# Patient Record
Sex: Male | Born: 1955 | Race: Black or African American | Hispanic: No | Smoking: Current every day smoker
Health system: Southern US, Community
[De-identification: ages and names within clinical notes are randomized; demographics above are authoritative.]

## PROBLEM LIST (undated history)

## (undated) DIAGNOSIS — I10 Essential (primary) hypertension: Secondary | ICD-10-CM

## (undated) DIAGNOSIS — I639 Cerebral infarction, unspecified: Secondary | ICD-10-CM

## (undated) HISTORY — DX: Essential (primary) hypertension: I10

---

## 2011-03-29 ENCOUNTER — Emergency Department (HOSPITAL_COMMUNITY): Payer: Self-pay

## 2011-03-29 ENCOUNTER — Inpatient Hospital Stay (HOSPITAL_COMMUNITY)
Admission: EM | Admit: 2011-03-29 | Discharge: 2011-04-02 | DRG: 064 | Disposition: A | Discharge status: Rehabilitation Facility | Payer: Self-pay | Attending: Internal Medicine | Admitting: Internal Medicine

## 2011-03-29 DIAGNOSIS — Z7982 Long term (current) use of aspirin: Secondary | ICD-10-CM

## 2011-03-29 DIAGNOSIS — E876 Hypokalemia: Secondary | ICD-10-CM | POA: Diagnosis present

## 2011-03-29 DIAGNOSIS — R279 Unspecified lack of coordination: Secondary | ICD-10-CM | POA: Diagnosis present

## 2011-03-29 DIAGNOSIS — R269 Unspecified abnormalities of gait and mobility: Secondary | ICD-10-CM

## 2011-03-29 DIAGNOSIS — I6340 Cerebral infarction due to embolism of unspecified cerebral artery: Principal | ICD-10-CM | POA: Diagnosis present

## 2011-03-29 DIAGNOSIS — G9340 Encephalopathy, unspecified: Secondary | ICD-10-CM | POA: Diagnosis present

## 2011-03-29 DIAGNOSIS — Z560 Unemployment, unspecified: Secondary | ICD-10-CM

## 2011-03-29 DIAGNOSIS — E119 Type 2 diabetes mellitus without complications: Secondary | ICD-10-CM | POA: Diagnosis present

## 2011-03-29 DIAGNOSIS — F17200 Nicotine dependence, unspecified, uncomplicated: Secondary | ICD-10-CM | POA: Diagnosis present

## 2011-03-29 DIAGNOSIS — F10939 Alcohol use, unspecified with withdrawal, unspecified: Secondary | ICD-10-CM | POA: Diagnosis present

## 2011-03-29 DIAGNOSIS — F1010 Alcohol abuse, uncomplicated: Secondary | ICD-10-CM | POA: Diagnosis present

## 2011-03-29 DIAGNOSIS — I10 Essential (primary) hypertension: Secondary | ICD-10-CM | POA: Diagnosis present

## 2011-03-29 DIAGNOSIS — F10239 Alcohol dependence with withdrawal, unspecified: Secondary | ICD-10-CM | POA: Diagnosis present

## 2011-03-29 DIAGNOSIS — F29 Unspecified psychosis not due to a substance or known physiological condition: Secondary | ICD-10-CM

## 2011-03-29 DIAGNOSIS — H538 Other visual disturbances: Secondary | ICD-10-CM | POA: Diagnosis present

## 2011-03-29 LAB — COMPREHENSIVE METABOLIC PANEL
ALT: 34 U/L (ref 0–53)
CO2: 28 mEq/L (ref 19–32)
Calcium: 11.1 mg/dL — ABNORMAL HIGH (ref 8.4–10.5)
Creatinine, Ser: 1.03 mg/dL (ref 0.50–1.35)
GFR calc Af Amer: >90 mL/min (ref >90)
GFR calc non Af Amer: 79 mL/min — ABNORMAL LOW (ref >90)
Glucose, Bld: 105 mg/dL — ABNORMAL HIGH (ref 70–99)

## 2011-03-29 LAB — DIFFERENTIAL
Lymphs Abs: 1.1 10*3/uL (ref 0.7–4.0)
Monocytes Relative: 9 % (ref 3–12)
Neutro Abs: 9.5 10*3/uL — ABNORMAL HIGH (ref 1.7–7.7)
Neutrophils Relative %: 82 % — ABNORMAL HIGH (ref 43–77)

## 2011-03-29 LAB — AMMONIA: Ammonia: 23 umol/L (ref 11–60)

## 2011-03-29 LAB — URINALYSIS, ROUTINE W REFLEX MICROSCOPIC
Glucose, UA: 100 mg/dL — AB
Ketones, ur: 15 mg/dL — AB
Leukocytes, UA: NEGATIVE
Protein, ur: NEGATIVE mg/dL

## 2011-03-29 LAB — CBC
Hemoglobin: 17.4 g/dL — ABNORMAL HIGH (ref 13.0–17.0)
MCH: 30.7 pg (ref 26.0–34.0)
MCV: 82.4 fL (ref 78.0–100.0)
RBC: 5.67 MIL/uL (ref 4.22–5.81)

## 2011-03-30 ENCOUNTER — Inpatient Hospital Stay (HOSPITAL_COMMUNITY): Payer: Self-pay

## 2011-03-30 ENCOUNTER — Encounter: Payer: Self-pay | Admitting: Internal Medicine

## 2011-03-30 LAB — BASIC METABOLIC PANEL
BUN: 27 mg/dL — ABNORMAL HIGH (ref 6–23)
BUN: 21 mg/dL (ref 6–23)
CO2: 29 mEq/L (ref 19–32)
CO2: 28 mEq/L (ref 19–32)
Calcium: 9.5 mg/dL (ref 8.4–10.5)
Chloride: 90 mEq/L — ABNORMAL LOW (ref 96–112)
Creatinine, Ser: 0.8 mg/dL (ref 0.50–1.35)
Creatinine, Ser: 0.7 mg/dL (ref 0.50–1.35)
GFR calc Af Amer: >90 mL/min (ref >90)
GFR calc non Af Amer: >90 mL/min (ref >90)
Glucose, Bld: 131 mg/dL — ABNORMAL HIGH (ref 70–99)
Glucose, Bld: 85 mg/dL (ref 70–99)

## 2011-03-30 LAB — LIPID PANEL
Cholesterol: 109 mg/dL (ref 0–200)
HDL: 27 mg/dL — ABNORMAL LOW (ref >39)
Total CHOL/HDL Ratio: 4 RATIO
Triglycerides: 61 mg/dL (ref <150)

## 2011-03-30 LAB — HIV ANTIBODY (ROUTINE TESTING W REFLEX): HIV: NONREACTIVE

## 2011-03-30 LAB — HEMOGLOBIN A1C: Mean Plasma Glucose: 128 mg/dL — ABNORMAL HIGH (ref <117)

## 2011-03-30 LAB — GLUCOSE, CAPILLARY
Glucose-Capillary: 100 mg/dL — ABNORMAL HIGH (ref 70–99)
Glucose-Capillary: 90 mg/dL (ref 70–99)

## 2011-03-30 LAB — RAPID URINE DRUG SCREEN, HOSP PERFORMED
Amphetamines: NOT DETECTED
Barbiturates: NOT DETECTED

## 2011-03-30 LAB — CK TOTAL AND CKMB (NOT AT ARMC)
CK, MB: 2 ng/mL (ref 0.3–4.0)
Total CK: 101 U/L (ref 7–232)

## 2011-03-30 LAB — ETHANOL: Alcohol, Ethyl (B): <11 mg/dL (ref 0–11)

## 2011-03-30 NOTE — H&P (Signed)
Hospital Admission Note Date: 03/30/2011  Patient name: Matthew Abbott Medical record number: [id number] Date of birth: 1956-03-20 Age: 55 y.o. Gender: male PCP: Dr. Arvil Persons ([facility]) but hasn't gone in 5-6 years  Medical Service: Internal Medicine Teaching Service  Attending physician: Dr. Cliffton Asters    1st Contact: Dr. Margorie John  Pager: [phone] 2nd Contact: Dr. Johnette Abraham Pager: [phone] After 5 pm or weekends: 1st Contact:      Pager: [phone] 2nd Contact:      Pager: [phone]  Chief Complaint: Abnormal Gait, Confusion History of Present Illness: Patient is a 55 yo man with PMH significant for HTN who presents with 1-2 days of abnormal gait & confusion that has since been improving.  He reports that 1 day PTA, he was sitting on a porch and all of a sudden felt something funny in his legs.  He needed assistance to stand up, and it is unclear if he sought medical attention from a nurse at that time.  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  His story is difficult to piece together, but his daughter was able to help with details, as she was called on the day of admission since her father had increased confusion, difficulty with gait & elevated blood pressures.  Patient reports that on the day of admission, BP was found to be 171/102 and then 168/103.  He was given 2 pills (daugther reports HCTZ, Lisinopril & Phenergan) and blood pressure decreased to 152/91.  He notes associated vomiting x 3 after taking medications, and reports non-bloody nor black vomitus to be yellow, and only 1 nonbloody, loose BM on the day prior to arrival.  He denies abdominal pain. He notes tolerating soup given in the ED but prior to that, he had no food since having a doughnut since 2 days PTA & he has not had an appetite.  Daughter describes confusion as calling her by the wrong name or knowing where his belongings are (ie-asking  for his cell phone, but it was left at home purposely).  Daughter also reports slurred speech on the day of arrival that has since resolved.  Finally patient notes increased blurry vision in left eye, but this has been ongoing for some months.  He reports that a branch hit his eye earlier in the summer and since he has noticed discharge & crusting each morning, which he has tried to treat with warm compresses upon suggestion by his daughter.  He denies acute vision changes over the last few days.    Meds: None  Allergies: NKDA  Past Medical History: HTN (hasn't been to MD in 5-6 years, reports Doctor took him off of antihypertensives)  Past Surgical History: none  Family History:  Mom: DM, HTN, ESRD, died @ 11 of heart failure Dad: ESRD, heart failure, Prostate Ca, died @ 57 yo  Social History  . Marital Status: Married    Spouse Name: N/A    Number of Children: Daughter, Lequita Halt (graduated from [employer] school recently)   Occupational History  . Currently unemployed   Social History Main Topics  . Smoking status: Current, 0.5 PPD x 42 years  . Alcohol Use: 24 oz, 1-2 cans beer/day, no history of DTs/withdrawal, but has never tried to quit before  . Drug Use: Denies illicit drug use   Social History Narrative  . 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  He lives alone in [place], but has a daughter and niece as close family support   Review of Systems: General: no fevers, chills, changes in weight, decrease in appetite Skin: no rash, dry skin HEENT: left eye blurry vision (chronic), no hearing changes, no sore throat Pulm: no dyspnea, coughing, wheezing CV: no chest pain, palpitations, shortness of breath Abd: as per HPI GU: no dysuria, hematuria, polyuria Ext: no arthralgias, myalgias Neuro: no weakness, numbness, or tingling  Physical Exam: Vitals: T:98.4     HR:97     BP: 135/81    RR:20     O2  saturation: 97% RA General: resting in bed, no acute distress HEENT: PERRL, EOMI, no scleral icterus, bilateral conjunctival injection, no conjunctival pallor Cardiac: RRR, no rubs, murmurs or gallops, normal S1, S2 Pulm: clear to auscultation bilaterally, moving normal volumes of air, no wheezing/rales/rhonchi Abd: soft, mildly tender to epigastric region, nondistended, BS normoactive Ext: warm and well perfused, no pedal edema Neuro: alert and oriented X3 (& to president of [place]), cranial nerves II-XII grossly intact, strength and sensation to light touch equal in bilateral upper and lower extremities, cerebellar function intact by finger-to-nose, heel-to-shin, & rapid alternating hand movments, unsteady, shuffling gait by both legs, DTRs grossly intact  Lab results: Basic Metabolic Panel:  [facility] 03/29/11 2003  NA 130*  K 4.5  CL 89*  CO2 28  GLUCOSE 105*  BUN 29*  CREATININE 1.03  CALCIUM 11.1*  MG --  PHOS --   Liver Function Tests:  Basename 03/29/11 2003  AST 33  ALT 34  ALKPHOS 118*  BILITOT 1.2  PROT 8.1  ALBUMIN 4.3    Basename 03/29/11 2003  AMMONIA 23   CBC:  Basename 03/29/11 2003  WBC 11.6*  NEUTROABS 9.5*  HGB 17.4*  HCT 46.7  MCV 82.4  PLT 157   Acetone, Blood                           NEGATIVE          NEG  Ammonia                                  23                11-60            umol/L   Color, Urine                             AMBER      a      YELLOW    BIOCHEMICALS MAY BE AFFECTED BY COLOR  Appearance                               CLOUDY     a      CLEAR  Specific Gravity                         1.022             1.005-1.030  pH  5.0               5.0-8.0  Urine Glucose                            100        a      NEG              mg/dL  Bilirubin                                NEGATIVE          NEG  Ketones                                  15         a      NEG              mg/dL  Blood                                     NEGATIVE          NEG  Protein                                  NEGATIVE          NEG              mg/dL  Urobilinogen                             0.2               0.0-1.0          mg/dL  Nitrite                                  NEGATIVE          NEG  Leukocytes                               NEGATIVE          NEG    MICROSCOPIC NOT DONE ON URINES WITH NEGATIVE PROTEIN, BLOOD, LEUKOCYTES,    NITRITE, OR GLUCOSE <1000 mg/dL.   Imaging results:  03/29/2011 CT HEAD WITHOUT CONTRAST  Comparison: None.   Findings: Hypodensity right superior cerebellum, most likely a subacute infarct.  Mass lesion not completely excluded.  Ventricle size is normal.  Negative for hemorrhage.  Calvarium intact.   IMPRESSION: Hypodensity right superior cerebellum, most likely an acute infarct without hemorrhage.  Follow-up MRI is suggested.    Assessment & Plan by Problem: 55 yo man with history of alcohol abuse p/w  #Ataxia/Abnormal Gait: Patient's symptoms suggest stroke, which is supported by CT scan (non hemorrhagic right cerebellar stroke).  Risk factors include HTN (per history) & smoking.  Other unlikely causes of abnormal gait include insult to sensory system by B12 deficiency, syphilis, or hypothyroidism.   Plan: follow up with neuro recommendations, admit to tele -MRI brain, MRA head without contrast,  TSH, RPR, B12, EKG -swallow eval -FLP and HbA1c with AML for risk stratification, monitor CBGs, UDS, CE -PT/OT  #Confusion: Acute onset confusion coincides with gait abnormalities and seems to be improving, which suggests that stroke may have been contributing.  Confusion may also be related to electrolyte abnormalities as patient is hyponatremic.  Hyponatremia is likely secondary to decreased PO intake & vomiting.  Infectious etiology may also cause confusion, though patient does not experience any symptoms of such.  Finally, if patient is experiencing delayed onset withdrawal or had  more recent EtOH use, he may be experiencing confusion 2/2 withdrawal.   Plan: Continue to monitor, hydrate with NS @ 125cc/h, check orthostatic vital signs, AML: BMET, UA & culture, CXR  #Nausea/Vomiting: Patient reports one day of nausea with 3 episodes of vomiting, which may be stroke related.  Patient could also be experiencing delayed EtOH withdrawal.  Infectious etiology unlikely as patient denies fever, has not had any food that might suggest infection, & has had limited episodes of vomiting without diarrhea that have been isolated with other symptoms.   Plan: Zofran 4mg  IV q6h prn N/V, once swallow screen passed, clear liquids and diet advanced as tolerated, if symptoms worsen, consider Lipase/Amylase  #EtOH Abuse: Though patient is likely out of the withdrawal window, and denies current or past symptoms of DTs, patient will be monitored with CIWA observation. Plan: start thiamine & folic acid supplementation, EtOH level    #Tobacco Abuse: Patient seems agreeable to discussing smoking cessation. Plan: smoking cessation consult, Nicotine Patch 14mg  daily  #HTN: Patient's blood pressure at goal at admission, though patient reports history of HTN.  Plan: Continue to monitor BP during hospitalization to evaluate if an anti-hypertensive regimen is needed.  #VTE Prophylaxis: Lovenox 40mg  [place] daily  #left eye blurry vision: patient may have corneal abrasion and may benefit from outpatient opthalmology appointment.   R3______________________________ (Dr. Nelda Bucks, [phone])   R1________________________________ (Dr. Vernice Jefferson, [phone])  ATTENDING: I performed and/or observed a history and physical examination of the patient.  I discussed the case with the residents as noted and reviewed the residents' notes.  I agree with the findings and plan--please refer to the attending physician note for more details.  Signature________________________________  Printed  Name_____________________________

## 2011-03-31 ENCOUNTER — Inpatient Hospital Stay (HOSPITAL_COMMUNITY): Payer: Self-pay

## 2011-03-31 DIAGNOSIS — F29 Unspecified psychosis not due to a substance or known physiological condition: Secondary | ICD-10-CM

## 2011-03-31 DIAGNOSIS — R269 Unspecified abnormalities of gait and mobility: Secondary | ICD-10-CM

## 2011-03-31 DIAGNOSIS — I6330 Cerebral infarction due to thrombosis of unspecified cerebral artery: Secondary | ICD-10-CM

## 2011-03-31 LAB — BASIC METABOLIC PANEL
BUN: 12 mg/dL (ref 6–23)
Calcium: 8.6 mg/dL (ref 8.4–10.5)
Creatinine, Ser: 0.57 mg/dL (ref 0.50–1.35)
GFR calc Af Amer: >90 mL/min (ref >90)
GFR calc non Af Amer: >90 mL/min (ref >90)

## 2011-03-31 MED ORDER — GADOBENATE DIMEGLUMINE 529 MG/ML IV SOLN
15.0000 mL | Freq: Once | INTRAVENOUS | Status: AC
  Administered 2011-03-31: 15 mL via INTRAVENOUS

## 2011-03-31 NOTE — Consult Note (Signed)
NAMEARLANDO, [name]:  [id number]  MEDICAL RECORD NO.:  [id number]  LOCATION:  [facility]                         FACILITY:  [facility]  PHYSICIAN:  Lajuana Carry, MD       DATE OF BIRTH:  10/12/1955  DATE OF CONSULTATION:  03/29/2011 DATE OF DISCHARGE:                                CONSULTATION   CHIEF COMPLAINT:  Confusion, possible stroke.  HISTORY:  [name] is a 55 year old African American male who presents for evaluation of new onset of confusion and difficulty with walking. The patient attempted to relate some of the history, but due to his confusion, his daughter relates most of it.  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  On Friday, when the daughter dropped him off, he was in his normal state of health, acting appropriately.  Over the course of the weekend, the patient developed severe stomachache, nausea, and abdominal pain.  He vomited several times.  He did see the nurse that was on this worksite, who noted that he had high blood pressure particularly today.  The patient was given several medications to lower that blood pressure and it did not come down significantly.  His blood pressure was noted to be 171/102 and his blood sugar was 232 at that time.  At this point, he was having some difficulty walking and they felt that he was weak allover.  He was confused and saying very strange things to the employees there.  They called his daughter to pick him up and he called his daughter by several long names and was also saying some strange things.  The patient did not have any hallucinations and no activity concerning for seizures.  He states that his last drink was on Wednesday, which is 4 days ago.  MEDICATIONS:  The patient takes home medications.  ALLERGIES:  He has no known drug allergies.  PAST MEDICAL HISTORY: 1. Hypertension. 2. Alcohol abuse.  FAMILY HISTORY:  Significant for hypertension and  alcohol abuse.  SOCIAL HISTORY:  The patient smokes approximately a third of pack cigarettes daily and drinks 1-2, 24-ounce beers a day.  REVIEW OF SYSTEMS:  A complete review of systems was performed and was negative.  PHYSICAL EXAMINATION:  VITAL SIGNS:  Temperature of 98.4, pulse of 97, blood pressure 135/81, respirations of 20, and oxygen saturation 97%. GENERAL:  He is awake and in no apparent distress. HEENT:  The patient has poor dentition.  His oropharynx is clear.  There is no cervical lymphadenopathy. CARDIOVASCULAR:  He had a regular rate and rhythm with no murmurs and no carotid bruits were noted. RESPIRATIONS:  His lungs were clear to auscultation bilaterally. ABDOMEN:  Soft, mildly tender throughout, nondistended and bowel sounds were present. EXTREMITIES:  There was no lower extremity peripheral edema. MENTAL STATUS:  He was alert and oriented x2.  He could follow simple and complex commands well.  His attention and concentration were intact, but his memory was impaired.  Speech was normal and fluent. CRANIAL NERVES:  Cranial nerve II; his visual fields were intact to confrontation bilaterally.  Cranial nerves III, IV, and VI; extraocular movements  were intact and his pupils were equal, round, reactive to light and accommodation.  Cranial nerve V; facial sensation was intact. Cranial nerve VII; facial movements were symmetric.  Cranial nerve VIII, auditory acuity was normal.  Cranial nerves IX and X; his palate and uvula were midline and elevated symmetrically in the midline.  Cranial nerve XI; he had 5/5 trapezius strength.  Cranial nerve XII, his tongue protruded in the midline. MOTOR:  Demonstrate a 5/5 strength in his bilateral upper and lower extremities and muscle groups proximally and distally.  The patient did have outward drift of his right hand when looking for pronator drift. There was no increased tone. SENSATION:  Sensation was intact to light touch and  pinprick throughout. REFLEXES:  1+ and symmetric in the bilateral upper and lower extremities.  There was no Babinski. COORDINATION:  He had intact finger-to-nose and heel-to-shin on the left.  He had dysmetria on the right arm and leg. GAIT:  Deferred due to his ataxia.  CT scan of the head was reviewed which demonstrated hyperdensity in the right superior cerebellum, which is most likely a stroke, although a mass lesion is not completely excluded.  Lab work was reviewed and revealed a white count of 12.6, hemoglobin of 17.4, platelets of 157.  Sodium of 130, potassium 4.5, chloride 89, CO2 of 28, glucose of 105, BUN of 29, creatinine of 1, bilirubin of 1.2, and normal AST and ALT.  ASSESSMENT:  [name] is a 55 year old male who presents with new- onset ataxia and confusion in the setting of hypertension, a few metabolic abnormalities, and alcoholism with his last drink 4 days ago. His exam demonstrates right-sided ataxia, which is consistent with his imaging showing a right superior cerebellar lesion.  Imaging could be consistent with stroke, although malignancy cannot be completely excluded.  A superior cerebellar lesion would not necessarily explain the patient's confusion.  Confusion could be due to many factors including his elevated blood pressure, potential for alcohol withdrawal and metabolic abnormalities mentioned with possible infectious etiologies if he does have leukocytosis.  PLAN: 1. We recommend evaluation and admission by the internal medicine     team. 2. We recommend obtaining an MRI of the brain and MRA of the head     without contrast to better characterize the lesion seen on the CT     scan.  We would hold on any other stroke workup or treatment until     the MRI has been obtained. 3. We would recommend checking a TSH, RPR, B12, and supplement the     patient with thiamine while placing him on the alcohol withdrawal     protocol. 4. Please check a UA  and culture, chest x-ray, and consider amylase     and lipase.          ______________________________ Lajuana Carry, MD     DM/MEDQ  D:  03/29/2011  T:  03/30/2011  Job:  [id number]  Electronically Signed by Lajuana Carry MD on 03/31/2011 10:45:34 AM

## 2011-04-01 DIAGNOSIS — R269 Unspecified abnormalities of gait and mobility: Secondary | ICD-10-CM

## 2011-04-01 DIAGNOSIS — F29 Unspecified psychosis not due to a substance or known physiological condition: Secondary | ICD-10-CM

## 2011-04-02 ENCOUNTER — Inpatient Hospital Stay (HOSPITAL_COMMUNITY)
Admission: RE | Admit: 2011-04-02 | Discharge: 2011-04-08 | DRG: 945 | Disposition: A | Discharge status: Home Health | Payer: Self-pay | Source: Ambulatory Visit | Attending: Physical Medicine & Rehabilitation | Admitting: Physical Medicine & Rehabilitation

## 2011-04-02 DIAGNOSIS — I6350 Cerebral infarction due to unspecified occlusion or stenosis of unspecified cerebral artery: Secondary | ICD-10-CM

## 2011-04-02 DIAGNOSIS — F1010 Alcohol abuse, uncomplicated: Secondary | ICD-10-CM

## 2011-04-02 DIAGNOSIS — N401 Enlarged prostate with lower urinary tract symptoms: Secondary | ICD-10-CM

## 2011-04-02 DIAGNOSIS — I6330 Cerebral infarction due to thrombosis of unspecified cerebral artery: Secondary | ICD-10-CM

## 2011-04-02 DIAGNOSIS — F17200 Nicotine dependence, unspecified, uncomplicated: Secondary | ICD-10-CM

## 2011-04-02 DIAGNOSIS — R269 Unspecified abnormalities of gait and mobility: Secondary | ICD-10-CM

## 2011-04-02 DIAGNOSIS — Z7982 Long term (current) use of aspirin: Secondary | ICD-10-CM

## 2011-04-02 DIAGNOSIS — I6789 Other cerebrovascular disease: Secondary | ICD-10-CM

## 2011-04-02 DIAGNOSIS — I10 Essential (primary) hypertension: Secondary | ICD-10-CM

## 2011-04-02 DIAGNOSIS — Z8249 Family history of ischemic heart disease and other diseases of the circulatory system: Secondary | ICD-10-CM

## 2011-04-02 DIAGNOSIS — N138 Other obstructive and reflux uropathy: Secondary | ICD-10-CM

## 2011-04-02 DIAGNOSIS — Z7901 Long term (current) use of anticoagulants: Secondary | ICD-10-CM

## 2011-04-02 DIAGNOSIS — Z720 Tobacco use: Secondary | ICD-10-CM | POA: Diagnosis present

## 2011-04-02 DIAGNOSIS — I639 Cerebral infarction, unspecified: Secondary | ICD-10-CM | POA: Diagnosis present

## 2011-04-02 DIAGNOSIS — R339 Retention of urine, unspecified: Secondary | ICD-10-CM

## 2011-04-02 DIAGNOSIS — I69993 Ataxia following unspecified cerebrovascular disease: Secondary | ICD-10-CM

## 2011-04-02 DIAGNOSIS — Z5189 Encounter for other specified aftercare: Principal | ICD-10-CM

## 2011-04-02 DIAGNOSIS — F29 Unspecified psychosis not due to a substance or known physiological condition: Secondary | ICD-10-CM

## 2011-04-02 DIAGNOSIS — R279 Unspecified lack of coordination: Secondary | ICD-10-CM

## 2011-04-02 LAB — LUPUS ANTICOAGULANT PANEL
DRVVT: 37.2 secs (ref 34.1–42.2)
PTT Lupus Anticoagulant: 40 secs (ref 28.0–43.0)

## 2011-04-02 LAB — PROTEIN S ACTIVITY: Protein S Activity: 116 % (ref 69–129)

## 2011-04-02 LAB — BASIC METABOLIC PANEL
BUN: 8 mg/dL (ref 6–23)
Chloride: 97 mEq/L (ref 96–112)
GFR calc Af Amer: >90 mL/min (ref >90)
Potassium: 3.5 mEq/L (ref 3.5–5.1)

## 2011-04-02 LAB — PROTEIN C ACTIVITY: Protein C Activity: 116 % (ref 75–133)

## 2011-04-02 LAB — BETA-2-GLYCOPROTEIN I ABS, IGG/M/A
Beta-2-Glycoprotein I IgA: 5 A Units (ref <20)
Beta-2-Glycoprotein I IgM: 0 M Units (ref <20)

## 2011-04-02 LAB — ANTITHROMBIN III: AntiThromb III Func: 83 % (ref 76–126)

## 2011-04-02 LAB — ANA: Anti Nuclear Antibody(ANA): NEGATIVE

## 2011-04-02 LAB — CARDIOLIPIN ANTIBODIES, IGG, IGM, IGA: Anticardiolipin IgA: 8 APL U/mL — ABNORMAL LOW (ref <22)

## 2011-04-03 DIAGNOSIS — I6330 Cerebral infarction due to thrombosis of unspecified cerebral artery: Secondary | ICD-10-CM

## 2011-04-03 DIAGNOSIS — I69993 Ataxia following unspecified cerebrovascular disease: Secondary | ICD-10-CM

## 2011-04-03 LAB — URINALYSIS, ROUTINE W REFLEX MICROSCOPIC
Glucose, UA: 100 mg/dL — AB
Hgb urine dipstick: NEGATIVE
Ketones, ur: 15 mg/dL — AB
Protein, ur: NEGATIVE mg/dL

## 2011-04-03 LAB — COMPREHENSIVE METABOLIC PANEL
ALT: 26 U/L (ref 0–53)
AST: 26 U/L (ref 0–37)
Albumin: 3 g/dL — ABNORMAL LOW (ref 3.5–5.2)
Alkaline Phosphatase: 92 U/L (ref 39–117)
CO2: 28 mEq/L (ref 19–32)
Chloride: 99 mEq/L (ref 96–112)
GFR calc non Af Amer: >90 mL/min (ref >90)
Potassium: 3.7 mEq/L (ref 3.5–5.1)
Total Bilirubin: 0.8 mg/dL (ref 0.3–1.2)

## 2011-04-03 LAB — CBC
Platelets: 267 10*3/uL (ref 150–400)
RBC: 4.68 MIL/uL (ref 4.22–5.81)
RDW: 14.4 % (ref 11.5–15.5)
WBC: 7.3 10*3/uL (ref 4.0–10.5)

## 2011-04-03 LAB — DIFFERENTIAL
Basophils Absolute: 0.1 10*3/uL (ref 0.0–0.1)
Lymphs Abs: 1.6 10*3/uL (ref 0.7–4.0)
Monocytes Absolute: 1.2 10*3/uL — ABNORMAL HIGH (ref 0.1–1.0)
Monocytes Relative: 17 % — ABNORMAL HIGH (ref 3–12)
Neutrophils Relative %: 58 % (ref 43–77)

## 2011-04-03 MED ORDER — VITAMIN B-1 100 MG PO TABS
100.0000 mg | ORAL_TABLET | Freq: Every day | ORAL | Status: DC
  Administered 2011-04-05 – 2011-04-08 (×4): 100 mg via ORAL
  Filled 2011-04-03 (×8): qty 1

## 2011-04-03 MED ORDER — TAMSULOSIN HCL 0.4 MG PO CAPS
0.4000 mg | ORAL_CAPSULE | Freq: Every day | ORAL | Status: DC
  Administered 2011-04-05 – 2011-04-07 (×3): 0.4 mg via ORAL
  Filled 2011-04-03 (×7): qty 1

## 2011-04-03 MED ORDER — HYDROCHLOROTHIAZIDE 12.5 MG PO CAPS
12.5000 mg | ORAL_CAPSULE | Freq: Every day | ORAL | Status: DC
  Administered 2011-04-05 – 2011-04-08 (×4): 12.5 mg via ORAL
  Filled 2011-04-03 (×7): qty 1

## 2011-04-03 MED ORDER — PROMETHAZINE HCL 25 MG/ML IJ SOLN: 12.5000 mg | Freq: Four times a day (QID) | INTRAMUSCULAR | Status: DC | PRN

## 2011-04-03 MED ORDER — PROMETHAZINE HCL 12.5 MG RE SUPP: 12.5000 mg | Freq: Four times a day (QID) | RECTAL | Status: DC | PRN

## 2011-04-03 MED ORDER — BISACODYL 10 MG RE SUPP: 10.0000 mg | Freq: Every day | RECTAL | Status: DC | PRN

## 2011-04-03 MED ORDER — SORBITOL 70 % SOLN: 30.0000 mL | Freq: Two times a day (BID) | Status: DC | PRN

## 2011-04-03 MED ORDER — TRAZODONE HCL 50 MG PO TABS: 25.0000 mg | ORAL_TABLET | Freq: Every evening | ORAL | Status: DC | PRN

## 2011-04-03 MED ORDER — ASPIRIN EC 325 MG PO TBEC
325.0000 mg | DELAYED_RELEASE_TABLET | Freq: Every day | ORAL | Status: DC
  Administered 2011-04-05 – 2011-04-08 (×4): 325 mg via ORAL
  Filled 2011-04-03 (×7): qty 1

## 2011-04-03 MED ORDER — NICOTINE 14 MG/24HR TD PT24
14.0000 mg | MEDICATED_PATCH | Freq: Every day | TRANSDERMAL | Status: DC
  Administered 2011-04-05 – 2011-04-06 (×2): 14 mg via TRANSDERMAL
  Filled 2011-04-03 (×6): qty 1

## 2011-04-03 MED ORDER — ENOXAPARIN SODIUM 40 MG/0.4ML ~~LOC~~ SOLN
40.0000 mg | SUBCUTANEOUS | Status: DC
  Administered 2011-04-05 – 2011-04-07 (×3): 40 mg via SUBCUTANEOUS
  Filled 2011-04-03 (×7): qty 0.4

## 2011-04-03 MED ORDER — PROMETHAZINE HCL 12.5 MG PO TABS: 12.5000 mg | ORAL_TABLET | Freq: Four times a day (QID) | ORAL | Status: DC | PRN

## 2011-04-03 MED ORDER — FOLIC ACID 1 MG PO TABS
1.0000 mg | ORAL_TABLET | Freq: Every day | ORAL | Status: DC
  Administered 2011-04-05 – 2011-04-08 (×4): 1 mg via ORAL
  Filled 2011-04-03 (×8): qty 1

## 2011-04-03 MED ORDER — ACETAMINOPHEN 325 MG PO TABS
325.0000 mg | ORAL_TABLET | ORAL | Status: DC | PRN
  Administered 2011-04-05: 650 mg via ORAL
  Filled 2011-04-03: qty 2

## 2011-04-04 DIAGNOSIS — F1010 Alcohol abuse, uncomplicated: Secondary | ICD-10-CM | POA: Diagnosis present

## 2011-04-04 DIAGNOSIS — I10 Essential (primary) hypertension: Secondary | ICD-10-CM | POA: Diagnosis present

## 2011-04-04 DIAGNOSIS — I69993 Ataxia following unspecified cerebrovascular disease: Secondary | ICD-10-CM

## 2011-04-04 DIAGNOSIS — Z720 Tobacco use: Secondary | ICD-10-CM | POA: Diagnosis present

## 2011-04-04 DIAGNOSIS — I6330 Cerebral infarction due to thrombosis of unspecified cerebral artery: Secondary | ICD-10-CM

## 2011-04-04 DIAGNOSIS — R339 Retention of urine, unspecified: Secondary | ICD-10-CM | POA: Diagnosis present

## 2011-04-04 DIAGNOSIS — I639 Cerebral infarction, unspecified: Secondary | ICD-10-CM | POA: Diagnosis present

## 2011-04-04 LAB — URINE CULTURE: Colony Count: 2000

## 2011-04-04 LAB — COMPLEMENT COMPONENT C1Q: C1q Complement Protein CC1Q: 7 mg/dL (ref 7–48)

## 2011-04-05 MED ORDER — AMLODIPINE BESYLATE 5 MG PO TABS
5.0000 mg | ORAL_TABLET | Freq: Every day | ORAL | Status: DC
  Administered 2011-04-05 – 2011-04-08 (×4): 5 mg via ORAL
  Filled 2011-04-05 (×7): qty 1

## 2011-04-05 NOTE — Progress Notes (Signed)
Occupational Therapy Session Note  Patient Details  Name: Matthew Abbott MRN: [id number] Date of Birth: Feb 15, 1956  Today's Date: 04/05/2011 Time: 0100-0200 Time Calculation (min): 60 min  Precautions:    Short Term Goals:    Skilled Therapeutic Interventions/Progress Updates: Therapeutic activities and therapeutic exercise this date with emphasis on bilateral-UE,  Dynamic standing balance, re-ed on fine motor control, use of leisure and sports interests (basketball, fishing), and active-assisted R-UE activities to improve control of R-UE.  Pt. Advised to practice block, upper case, printing using lined paper, basketball dribbling, fishing and reeling, and painting with brush using water (to rehearse prior to vocational activity).   Pt. demo'd enthusiasm for all activities with good attention to instruction.     General   Vital Signs   Pain   ADL    Exercises General Exercises - Upper Extremity Shoulder Flexion: Theraband;10 reps;Standing;Both;Strengthening (Introduced to "bilateral" UE ) Theraband Level (Shoulder Flexion): Level 4 (Blue) Elbow Flexion: Theraband;10 reps;Both;Standing;Strengthening Theraband Level (Elbow Flexion): Level 4 (Blue) Chair Push Up: Standing;Strengthening;Both (Wall- push-up for strengthening chest/shoulders/arms) Hand Activities Writing Skills: Right Laces, Tie, Button, Snap: Both  Other Treatments Treatments Therapeutic Activity: Educated and demo'd bilateral UE gross and fine motor skills for rehab goals: basketball (dribble, pass), fishing (cast and reel).  Therapy/Group: Individual Therapy  Georgeanne Nim 04/05/2011, 2:27 PM

## 2011-04-05 NOTE — Progress Notes (Signed)
  Subjective:    Patient ID: Matthew Abbott, male    DOB: 1956/03/17, 55 y.o.   MRN: [id number]  HPI Comments: No specific complaints    Filed Vitals:   04/05/11 0608  BP: 187/70  Pulse: 57  Temp: 98.5 F (36.9 C)  Resp: 16      Review of Systems     Objective:   Physical Exam  no acute distress. HEENT exam atraumatic, normocephalic, neck supple without jugular venous distention. Chest clear to auscultation cardiac exam S1-S2 are regular. Abdominal exam thin with bowel sounds, soft and nontender. Extremities no edema. Neurologic exam is alert.     Assessment & Plan:    A/P Cerebellar infarct ---Ongoing Rehab Hypertension -- not controlled, add norvasc Alcohol abuse -- outpatient f/u Tobacco abuse --counseling given Urinary retention --- flomax, will continue

## 2011-04-05 NOTE — Progress Notes (Signed)
Patient ambulates with standby assist to the bathroom. Able to make needs known. Compliant. Continent of bowel and bladder. Last bowel movement 11/3. Uses the urinal to void or at times calls to void in bathroom. Denies pain this shift. Skin is dry. Lotion applied to skin. Participates in therapy.

## 2011-04-06 LAB — PROTEIN C, TOTAL: Protein C, Total: 78 % (ref 72–160)

## 2011-04-06 LAB — PROTEIN S, TOTAL: Protein S Ag, Total: 147 % (ref 60–150)

## 2011-04-06 LAB — FACTOR 5 LEIDEN

## 2011-04-06 NOTE — Progress Notes (Signed)
Occupational Therapy Note  Patient Details  Name: Matthew Abbott MRN: [id number] Date of Birth: 08-30-1955 Today's Date: 04/06/2011 Time:  1100-1105  Pt refused OT session secondary to fatigue and reported no sleep over night.  Encouarged pt to participate and the importance of participating in therapy.  Pt insisted on remaining in bed.  Pt reports no pain.  Will follow up.   Olam Idler 04/06/2011, 11:28 AM

## 2011-04-06 NOTE — Progress Notes (Signed)
Patient is alert and orientedx3. He is continent of both bowel and bladder. Patient has denied any pain or discomfort this shift. Continue current plan of care.

## 2011-04-06 NOTE — Progress Notes (Signed)
Subjective/Complaints: Patient denies any new problems today. He participated in rehabilitation over the weekend. He is ambulating in his room with supervision assistance.  Objective: Vital Signs: Blood pressure 121/74, pulse 56, temperature 97.8 F (36.6 C), temperature source Oral, resp. rate 15, height 5\' 11"  (1.803 m), weight 66.6 kg (146 lb 13.2 oz), SpO2 100.00%. No results found. Results for orders placed during the hospital encounter of 04/02/11 (from the past 72 hour(s))  URINALYSIS, ROUTINE W REFLEX MICROSCOPIC     Status: Abnormal   Collection Time   04/03/11 12:54 PM      Component Value Range Comment   Color, Urine AMBER (*) YELLOW  BIOCHEMICALS MAY BE AFFECTED BY COLOR   Appearance CLEAR  CLEAR     Specific Gravity, Urine 1.016  1.005 - 1.030     pH 7.0  5.0 - 8.0     Glucose, UA 100 (*) NEGATIVE (mg/dL)    Hgb urine dipstick NEGATIVE  NEGATIVE     Bilirubin Urine SMALL (*) NEGATIVE     Ketones, ur 15 (*) NEGATIVE (mg/dL)    Protein, ur NEGATIVE  NEGATIVE (mg/dL)    Urobilinogen, UA 1.0  0.0 - 1.0 (mg/dL)    Nitrite NEGATIVE  NEGATIVE     Leukocytes, UA NEGATIVE  NEGATIVE  MICROSCOPIC NOT DONE ON URINES WITH NEGATIVE PROTEIN, BLOOD, LEUKOCYTES, NITRITE, OR GLUCOSE <1000 mg/dL.  URINE CULTURE     Status: Normal   Collection Time   04/03/11 12:54 PM      Component Value Range Comment   Specimen Description URINE, CLEAN CATCH      Special Requests NONE      Setup Time [id number]      Colony Count 2,000 COLONIES/ML      Culture INSIGNIFICANT GROWTH      Report Status 04/04/2011 FINAL        HEENT: normal Cardio: RRR Resp: Lungs are clear to auscultation GI: Abdomen positive bowel sounds soft nontender to palpation Extremity: No clubbing cyanosis or edema Skin: No evidence of skin breakdown Neuro: Ataxia in the right upper greater than right lower extremity. This 4/5 in the right upper extremity and 5 out of 5 in the right lower extremity as well as 5 out of 5 on  the left side Musc/Skel: No pain with active range of motion of the upper or lower extremities. Pain with ambulation. Patient ambulates with supervision assistance in the room   Assessment/Plan: 1. 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 Physiatrist is providing close team supervision and 24 hour management of active medical problems listed below. Physiatrist and rehab team continue to assess barriers to discharge/monitor patient progress toward functional and medical goals. Mobility:     Ambulation/Gait Ambulation/Gait Assistance: 5: Supervision    ADL:    Cognition:      2. Anticoagulation/DVT prophylaxis with Pharmaceutical: Lovenox 3. 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/04/2011)    POA: Yes   Assessment: Deficits stable    Plan: Continue rehabilitation program  Hypertension (04/04/2011)    POA: Yes   Assessment: Blood pressure well controlled    Plan: Continue Norvasc and hydrochlorothiazide  Alcohol abuse (04/04/2011)    POA: Yes   Assessment: Patient reports only 8 beers per week    Plan: Alcohol withdrawal l is unlikely given this amount  Tobacco abuse (04/04/2011)  POA: Yes   Assessment: No evidence of cravings    Plan: Continued NicoDerm  Urinary retention (04/04/2011)    POA: Yes   Assessment: Benign prostatic hypertrophy    Plan: Continue Flomax   4  Sherril Shipman E 04/06/2011, 8:54 AM

## 2011-04-06 NOTE — H&P (Signed)
NAMEDAMONE, FANCHER NO.:  [id number]  MEDICAL RECORD NO.:  [id number]  LOCATION:  4033                         FACILITY:  [facility]  PHYSICIAN:  Erick Colace, M.D.DATE OF BIRTH:  May 30, 1956  DATE OF ADMISSION:  04/02/2011 DATE OF DISCHARGE:                             HISTORY & PHYSICAL   REASON FOR ADMISSION:  Right-sided incoordination.  HISTORY OF PRESENT ILLNESS:  A 55 year old male with prior history of hypertension and smoking, on no medications was admitted on March 29, 2011 with altered mental status and difficulty with ambulation.  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  He had MRA showing no occlusive disease, echocardiogram showed ejection fraction of 55% with no wall motion abnormalities or source of embolism. TEE also showed no evidence of embolism.  Carotid Dopplers were negative.  Neurology evaluated the patient.  Recommended aspirin for stroke prophylaxis and subcu Lovenox for DVT prophylaxis.  NicoDerm patch added for tobacco abuse and withdrawal.  PT and OT were initiated. The patient was making slow progress and PM and R consult was obtained on March 31, 2011, felt to be a good rehab candidate.  REVIEW OF SYSTEMS:  Positive for dizziness, problems with shaky right arm and leg, otherwise negative.  Please see full review 12 point review in the written history and physical.  PAST HISTORY:  Hypertension.  HABITS:  Include approximately 1-1/2 pack per day smoker.  Ethanol use was about 8 beers per week.  FAMILY HISTORY:  Positive CAD.  SOCIAL HISTORY:  Lives alone, unemployed, two-level home with bedroom downstairs.  One step to enter.  Prior functional history independent.  LAST LABS:  Hemoglobin 17.4, white count 11, platelets 157,000.  Sodium 134, potassium 3.5.  PHYSICAL EXAMINATION:  VITAL SIGNS:  Blood pressure 174/84, pulse 66, respirations 20, temp 98.2. GENERAL:  This is a thin male in  no acute distress. HEENT:  Eyes anicteric, noninjected.  External ENT negative. NECK:  Supple without adenopathy. RESPIRATORY:  Effort is good.  Lungs are clear. HEART:  Regular rate and rhythm.  No rubs, murmurs, or extra sounds. ABDOMEN:  Positive bowel sounds.  Soft, nontender to palpation. NEUROLOGIC:  His finger-nose-finger testing showed moderate dysmetria on the right side, normal on the left side.  Heel-to-shin testing moderate dysmetria on the right side, normal left side.  Motor strength 5/5 bilateral upper  and lower extremities.  Cranial nerves II to XII are intact.  Speech without evidence of dysarthria or ectasia.  Mood, memory, affect are all intact.  Sitting balance is good.  POST ADMISSION PHYSICIAN EVALUATION: 1. 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 2. The patient admitted to receive collaborative interdisciplinary     care between the physiatrist, rehab nursing staff, and therapy     team. 3. Patient's level of medical complexity and substantial therapy needs     in context of medical necessity cannot be provide at a lesser     intensity of care such as SNF. 4. The patient has experienced substantial functional loss from his     baseline.  Upon functional assessment at time of preadmission  screening, the patient was at a min assist level bed mobility, min     assist transfers, mod assist ambulation 75 feet.  Currently, the     patient is at a min assist level with gait 250 feet, set up upper     body, min assist lower body dressing.  Judging by the patient's     diagnosis, physical exam, functional history, the patient has     displayed ability to make functional progress, which will result in     measurable gains while on inpatient rehab.  These gains will be of     substantial practical use upon discharge to home, facilitating     mobility, self-care, and independence.  Interim change in medical      status since preadmission screening are detailed in history of     present illness. 5. Physiatrist to provide 24 hour management of medical needs as well     as oversight of therapy plan/treatment, provide guidance     appropriate regarding the interaction of the two. 6. A 24-hour rehab nursing will assess in management of skin, bowel,     bladder, and help integrate therapy, concepts, techniques,     education. 7. PT will assess and treat for pre-gait training, gait training,     endurance, mobility.  Goals are for a modified independent with all     mobility. 8. OT will assess and treat for ADLs, cognitive perceptual skills,     neuromuscular reeducation.  Goals are for modified independent with     all ADLs. 9. Case management and social work will assess and treat for     psychosocial issues and discharge planning. 10.Team conference will be held weekly to assess the patient's     progress/goals and to determine barriers to discharge. 11.The patient demonstrates sufficient medical stability and exercise     capacity to tolerate at least 3 hours therapy per day at least 5     days per week. 12.Estimated length stay is 10 days.  PROGNOSIS:  For further functional improvement is good.  MEDICAL PROBLEM LIST AND PLAN: 1. DVT prophylaxis subcu Lovenox. 2. Stroke prophylaxis,  aspirin 325 mg. 3. Tobacco abuse.  NicoDerm patch counseling. 4. Alcohol abuse, was obtained his prior history, however, the patient     states that he only drinks about 8 cans of beer per week, and this     is spread out during the week.  I do not think he will go through     any withdrawals if that is indeed the correct history.  His urine     drug screen initially was negative for alcohol. 5. Hypertension, hydrochlorothiazide daily. 6. Motivation and mood appear to be adequate for full participation in     inpatient rehabilitation program. 7. Rehab Medicine Services were explained to the patient, questions      answered.    Erick Colace, M.D.    AEK/MEDQ  D:  04/02/2011  T:  04/03/2011  Job:  [id number]  Electronically Signed by Claudette Laws M.D. on 04/06/2011 11:29:12 AM

## 2011-04-06 NOTE — Progress Notes (Signed)
Occupational Therapy Session Note  Patient Details  Name: NARAYAN SCULL MRN: [id number] Date of Birth: 09/23/1955  Today's Date: 04/06/2011 Time: 1330-1400 Time Calculation (min): 30 min.  Precautions: Precautions Precautions: Fall Restrictions Weight Bearing Restrictions: No  Skilled Therapeutic Interventions/Progress Updates:    Engaged in functional mobility and higher level activities.  Focus on functional mobility with and without use of RW.  Pt benefitted from RW initially in session secondary to decreased stability with 1 LOB to Rt and complaint of slight dizziness upon sit to stand.  Educated pt on gaining balance and pausing before beginning to walk.  Functional mobility in gift shop with ability to maneuver people and narrow shelves while maintaining balance.  Educated on energy conservation and ability to identify locations to conserve energy as needed.  Pt demonstrated appropriate safety awareness in this session.  General General Chart Reviewed: Yes Amount of Missed OT Time (min): 55 Minutes Missed Time Reason: Patient fatigue;Patient unwilling/refused to participate without medical reason Vital Signs   Pain Pain Assessment Pain Assessment: No/denies pain Pain Score: 0-No pain  Therapy/Group: Individual Therapy  Olam Idler. 04/06/2011, 2:13 PM

## 2011-04-06 NOTE — Progress Notes (Addendum)
Physical Therapy Session Note  Patient Details  Name: Matthew Abbott MRN: [id number] Date of Birth: 05/17/1956  Today's Date: 04/06/2011 Time: 1430-1510-   Precautions: Precautions Precautions: Fall Restrictions Weight Bearing Restrictions: No  :    Skilled Therapeutic Interventions/Progress Updates: Focus of treatment: gait training on unlevel surfaces;dynamic balance activities to facilitate balance reactions in standing.           Pain Pain Assessment Pain Assessment: No/denies pain Pain Score: 0-No pain Mobility Locomotion  Ambulation Ambulation: Yes Ambulation/Gait Assistance: 5: Supervision Ambulation Distance (Feet): 500 Feet Assistive device: Rolling walker Ambulation/Gait Assistance Details (indicate cue type and reason): Unlevel surfaces/sidewalk High Level Ambulation High Level Ambulation: Other high level ambulation (Dynamic balance tosing ball during ambulation/min asist)   Up/down 12 steps with 1 rail step over step up/step to step down close supervision    Other Treatments  Biodex - limits of stability to facilitate hip/ankle strategies  Therapy/Group: Individual Therapy  Matthew Abbott,[name] 04/06/2011, 3:08 PM

## 2011-04-06 NOTE — Progress Notes (Signed)
Physical Therapy Session Note  Patient Details  Name: Matthew Abbott MRN: [id number] Date of Birth: 17-Feb-1956  Today's Date: 04/06/2011 Time:  - 0900- 0955   Precautions: Precautions Precautions: Fall Restrictions Weight Bearing Restrictions: No  :    Skilled Therapeutic Interventions/Progress Updates: Focus of treatment; therapeutic activities to improve cordination Rt LE during functional activities/gait         Pain Pain Assessment Pain Assessment: No/denies pain Mobility Bed Mobility Bed Mobility: Yes Supine to Sit: 7: Independent Transfers Transfers: Yes Stand Pivot Transfers: 5: Supervision Stand Pivot Transfer Details (indicate cue type and reason): Decreeased coordination R LE Locomotion  Ambulation Ambulation: Yes Ambulation/Gait Assistance: 5: Supervision Ambulation Distance (Feet): 150 Feet Assistive device: Rolling walker High Level Ambulation High Level Ambulation: Side stepping;Backwards walking;Direction changes;Other high level ambulation High Level Ambulation - Other Comments: Tandem giait using balance beam  with iintermittent assist for balance Stairs / Additional Locomotion Stairs: No Wheelchair Mobility Wheelchair Mobility: No   Exercises  R LE open chair coordination exercises Other Treatments    Therapy/Group: Individual Therapy  Matthew Abbott,[name] 04/06/2011, 10:08 AM

## 2011-04-07 DIAGNOSIS — I69993 Ataxia following unspecified cerebrovascular disease: Secondary | ICD-10-CM

## 2011-04-07 DIAGNOSIS — I6330 Cerebral infarction due to thrombosis of unspecified cerebral artery: Secondary | ICD-10-CM

## 2011-04-07 MED ORDER — TAMSULOSIN HCL 0.4 MG PO CAPS: 0.4000 mg | ORAL_CAPSULE | Freq: Every day | ORAL | Status: AC

## 2011-04-07 MED ORDER — AMLODIPINE BESYLATE 5 MG PO TABS: 5.0000 mg | ORAL_TABLET | Freq: Every day | ORAL | Status: AC

## 2011-04-07 MED ORDER — HYDROCHLOROTHIAZIDE 12.5 MG PO CAPS: 12.5000 mg | ORAL_CAPSULE | Freq: Every day | ORAL | Status: DC

## 2011-04-07 MED ORDER — NICOTINE 7 MG/24HR TD PT24
7.0000 mg | MEDICATED_PATCH | Freq: Every day | TRANSDERMAL | Status: DC
  Filled 2011-04-07 (×2): qty 1

## 2011-04-07 NOTE — Progress Notes (Signed)
Social Work  Team feels pt has met his goals and is ready for discharge tom, 11/7. Pt is pleased with this and ready to go. He has all of his DME. Discussed with him followup therapies. He is agreeable to Outpatient Rehab. He reports daughter can take him. Will make ref to [facility] for PT,OT followup. Have set him up with [facility] for Dec for followup MD.Talk with daughter to see if any concerns.

## 2011-04-07 NOTE — Progress Notes (Signed)
Occupational Therapy Discharge Summary  Patient Details  Name: ELIO HADEN MRN: [id number] Date of Birth: [birth year] Today's Date: 04/07/2011  Patient has met 11 of 11 long term goals due to improved balance and ability to compensate for deficits.  Patient's daughter has not been present for any therapy or education.  Patient has demonstrated modified independence with RW with functional ambulation, advanced ADLs, and basic ADLs.  Patient requires occasional cues for safety awareness and appropriate use of RW due to impulsivity and decreased safety awareness.  Recommendation:  Patient will continue to received OT services in an outpatient setting to continue to advance functional skills in the area of iADL and Rt UE fine motor and gross motor control..  Equipment: Equipment provided: RW  Patient/family agrees with progress made and goals achieved: Yes  Olam Idler. 04/07/2011, 4:15 PM

## 2011-04-07 NOTE — Progress Notes (Addendum)
Physical Therapy Session Note  Patient Details  Name: Matthew Abbott MRN: [id number] Date of Birth: August 09, 1955  Today's Date: 04/07/2011 Time:  - 2130-8657   Precautions: Precautions Precautions: Fall Restrictions Weight Bearing Restrictions: No      Skilled Therapeutic Interventions/Progress Updates: Pt. Education for early discharge probably tomorrow.  Pt. Had LOB when walking with PT without AD, but realizes that to be safe, he must use RW at D/C.     General Chart Reviewed: Yes Vital Signs Therapy Vitals Temp: 99 F (37.2 C) Temp src: Oral Pulse Rate: 71  Resp: 18  BP: 149/83 mmHg (nurse aware) Patient Position, if appropriate: Lying Oxygen Therapy SpO2: 99 % O2 Device: None (Room air) Pain Pain Assessment Pain Assessment: No/denies pain Mobility Transfers Transfers:  (simulated car and floor with mod I) Sit to Stand: 6: Modified independent (Device/Increase time) Stand Pivot Transfers: 6: Modified independent (Device/Increase time) Locomotion  Ambulation Ambulation/Gait Assistance: 6: Modified independent (Device/Increase time) Ambulation Distance (Feet): 160 Feet Assistive device: Rolling walker High Level Ambulation High Level Ambulation: Side stepping;Backwards walking Stairs / Additional Locomotion Stairs: Yes Stairs Assistance: 6: Modified independent (Device/Increase time) Stair Management Technique: One rail Left Number of Stairs: 12  Wheelchair Mobility Wheelchair Mobility: No      Balance Balance Balance Assessed: Yes Standardized Balance Assessment Standardized Balance Assessment: Berg Balance Test Berg Balance Test Sit to Stand: Able to stand without using hands and stabilize independently Standing Unsupported: Able to stand safely 2 minutes Sitting with Back Unsupported but Feet Supported on Floor or Stool: Able to sit safely and securely 2 minutes Stand to Sit: Sits safely with minimal use of hands Transfers: Able to transfer  safely, minor use of hands Standing Unsupported with Eyes Closed: Able to stand 10 seconds safely Standing Ubsupported with Feet Together: Able to place feet together independently and stand 1 minute safely From Standing, Reach Forward with Outstretched Arm: Can reach confidently >25 cm (10") From Standing Position, Pick up Object from Floor: Able to pick up shoe safely and easily From Standing Position, Turn to Look Behind Over each Shoulder: Looks behind from both sides and weight shifts well Turn 360 Degrees: Able to turn 360 degrees safely but slowly Standing Unsupported, Alternately Place Feet on Step/Stool: Able to stand independently and complete 8 steps >20 seconds Standing Unsupported, One Foot in Front: Loses balance while stepping or standing Standing on One Leg: Able to lift leg independently and hold equal to or more than 3 seconds Total Score: 47  Dynamic Standing Balance Dynamic Standing - Balance Activities: Compliant surfaces   Exercises General Exercises - Lower Extremity Hip ABduction/ADduction: Strengthening;10 reps;Standing  Other Treatments    Therapy/Group: Individual Therapy  Colsen Modi 04/07/2011, 9:59 AM

## 2011-04-07 NOTE — Progress Notes (Signed)
Occupational Therapy Session Note  Patient Details  Name: Matthew Abbott MRN: [id number] Date of Birth: 11-Sep-1955  Today's Date: 04/07/2011 Time:  - missed 60 min    Precautions: Precautions Precautions: Fall Restrictions Weight Bearing Restrictions: No   Skilled Therapeutic Interventions/Progress Updates: Pt declined participating in bathing and dressing activity today secondary to "no sleep and them having difficulty with my patches resulting in me not feeling good." Pt missed .  Pt did dress completely and correctly at mod I with his RW. PT to make pt Mod I in room today.    Vital Signs Therapy Vitals Temp: 99 F (37.2 C) Temp src: Oral Pulse Rate: 71  Resp: 18  BP: 149/83 mmHg (nurse aware) Patient Position, if appropriate: Lying Oxygen Therapy SpO2: 99 % O2 Device: None (Room air) Pain Pain Assessment Pain Assessment: No/denies pain   Therapy/Group: missed tx  Melonie Florida 04/07/2011, 9:53 AM

## 2011-04-07 NOTE — Discharge Summary (Signed)
Matthew Abbott, Matthew Abbott NO.:  [id number]  MEDICAL RECORD NO.:  [id number]  LOCATION:  4033                         FACILITY:  [facility]  PHYSICIAN:  Erick Colace, M.D.DATE OF BIRTH:  08-Feb-1956  DATE OF ADMISSION:  04/02/2011 DATE OF DISCHARGE:                              DISCHARGE SUMMARY   DISCHARGE DIAGNOSES: 1. Right superior cerebellar infarction. 2. Subcutaneous Lovenox for deep vein thrombosis prophylaxis. 3. Hypertension. 4. Alcohol and tobacco abuse.  A 55 year old black male with history of hypertension, on no present medications, admitted on March 29, 2011, with altered mental status and difficulty with ambulation.  MRI showed superior cerebellar artery acute, subacute infarction.  MRA negative for occlusion.  Urine drug screen negative.  Echocardiogram with ejection fraction 55%, no wall motion changes.  Carotid Dopplers were negative.  Neurology consulted, placed on aspirin therapy.  Subcutaneous Lovenox for deep vein thrombosis prophylaxis.  TEE negative for thrombus, ejection fraction 55%.  A NicoDerm patch was added for tobacco abuse.  He required minimal assist to ambulate.  He was admitted for comprehensive rehab program.  PAST MEDICAL HISTORY:  See discharge diagnoses.  ALLERGIES:  None.  SOCIAL HISTORY:  He smokes 1/2-1 pack per day.  He does admit to alcohol, but he could not recall how much.  Lives alone, unemployed, 2- level home, bedroom downstairs, 1 step to entry.  FUNCTIONAL HISTORY:  Prior to admission, was independent.  FUNCTIONAL STATUS:  Upon admission to [facility], was minimal assist bed mobility, minimal assist transfers, moderate assist ambulate 75 feet without assistive device.  Minimal assist for activities of daily living.  MEDICATIONS:  Prior to admission, none.  LATEST LABORATORY RESULTS:  Hemoglobin 17.4, hematocrit 46.7, sodium 134, potassium 3.5.  PHYSICAL EXAMINATION:  VITAL SIGNS:  Blood pressure  174/84, pulse 66, temperature 98.2, respirations 20. GENERAL:  This was an alert male, in no acute distress oriented x3. HEENT:  Poor dental hygiene. MUSCULOSKELETAL:  Deep tendon reflexes 2+.  Sensation intact to light touch. LUNGS:  Clear to auscultation. CARDIAC:  Regular rate and rhythm. ABDOMEN:  Soft, nontender.  Good bowel sounds.  REHABILITATION HOSPITAL COURSE:  The patient was admitted to Inpatient Rehab Services with therapies initiated on a 3-hour daily basis, consisting of physical therapy, occupational therapy, and rehabilitation nursing.  The following issues were addressed during the patient's rehabilitation stay.  Pertaining to Mr. Calo's right superior cerebellar infarction, remained stable, maintained on aspirin therapy. He had been on subcutaneous Lovenox throughout his rehab course for deep vein thrombosis prophylaxis.  Blood pressures monitored on hydrochlorothiazide as well as the addition of Norvasc with no orthostatic changes.  He did have a long history of alcohol and tobacco abuse.  He received full counseling in regard to cessation of any alcohol or tobacco products.  It was questionable if he would be compliant with these requests.  He had been provided with a NicoDerm patch.  He exhibited no signs of alcohol withdrawal.  The patient received weekly collaborative interdisciplinary team conferences to discuss estimated length of stay, family teaching, and any barriers to discharge.  He was ambulating extended distances without assistive device.  His balance had greatly improved.  He  was modified independence for transfers, simple set up for activities of daily living.  He was advised no driving.  Home Health therapies had been arranged as per [employer].  He would follow up with Dr. Claudette Laws at the Outpatient Rehab Service office as needed.  He exhibited no swallowing difficulties on a regular diet.  DISCHARGE MEDICATIONS: 1. Norvasc 5 mg  p.o. daily. 2. Aspirin 325 mg p.o. daily. 3. Hydrochlorothiazide 12.5 mg p.o. daily. 4. Flomax 0.4 mg at bedtime. 5. He was provided with a NicoDerm patch to use as directed.  His diet was regular.  The patient again advised no smoking or alcohol. Home Health therapies had been arranged with Home Health with Rehab Services.     Mariam Dollar, P.A.   ______________________________ Erick Colace, M.D.    DA/MEDQ  D:  04/07/2011  T:  04/07/2011  Job:  [id number]  cc:   Pramod P. Pearlean Brownie, MD

## 2011-04-07 NOTE — Progress Notes (Signed)
Physical Therapy Discharge Summary  Patient Details  Name: INFANT ZINK MRN: [id number] Date of Birth: 23-Feb-1956 Today's Date: 04/07/2011  Patient has met 9 of 9 long term goals due to improved activity tolerance, improved balance, increased strength, ability to compensate for deficits, functional use of  right upper extremity and right lower extremity, improved awareness and improved coordination. Pt. Continues to exhibit balance deficits, and scored 47/56 on Berg 04/07/11.  Right extremities continue with ataxia, UE> LE.  Floor recovery was mod I, using furniture.  Patient to discharge at an ambulatory level Modified Independent.    High-level balance and home safety, using RW, practiced in today's tx session, to decrease fall risk.   Recommendation:  Patient will benefit from ongoing skilled PT services in outpatient setting to continue to advance safe functional mobility, address ongoing impairments in high-level balance, and minimize fall risk.  Equipment: No equipment provided. Patient will use borrowed RW.  Patient/family agrees with progress made and goals achieved: Yes  Karlye Ihrig 04/07/2011, 1:26 PM

## 2011-04-07 NOTE — Progress Notes (Signed)
Recreational Therapy Assessment and Plan  Patient Details  Name: Matthew Abbott MRN: [id number] Date of Birth: 1956/03/03  Rehab Potential:Good   ELOS: 7 days (potential d/c tomorrow 04/08/11)  Assessment Clinical Impression: Met with pt and discussed leisure interests and community pursuits.  Pt was active and working PTA and desires to return to prior level of function.  No formal TR treatment plan implemented secondary to potential d/c tomorrow home at [employer].  Education provided on community pursuits, energy conservation, identification and negotiation of barriers.   Recreational Therapy Leisure History/Participation Premorbid leisure interest/past participation: Games - Cards;Nature - Lawn care;Crafts - Woodworking;Community - Shopping mall;Community - Grocery store;Community - Travel (Comment) Expression Interests: Music (Comment);Dance (all kinds, used to [employer]) Spiritual Interests: Church Other Leisure Interests: Movies;Television;Reading;Computer;Cooking/Baking Identified Leisure Barriers: transportation post d/c Leisure Participation Style: Alone;With Family/Friends Awareness of Community Resources: Good-identify 3 post discharge leisure resources [employer] Appropriate for Education?: Yes Patient Agreeable to [employer]?:  (n/a) Stress Management: Good Methods of Stress Management:  (diversion initially, then sleep on it, next day think on it ) Patient agreeable to Pet Therapy: No Does patient have pets?: No Social interaction - Mood/Behavior: Cooperative Recreational Therapy Orientation Orientation -Reviewed with patient: Available activity resources Strengths/Weaknesses Patient Strengths/Abilities: Active premorbidly Patient weaknesses: Physical limitations  Plan  n/a  Discharge Criteria: Patient will be discharged from TR if patient refuses treatment 3 consecutive times without medical reason.  If treatment goals not met, if there is a change in  medical status, if patient makes no progress towards goals or if patient is discharged from hospital.  The above assessment, treatment plan, treatment alternatives and goals were discussed and mutually agreed upon: by patient.  Lakera Viall Time:  3:00-3:45 Time Calculation:  45 minutes 04/07/2011, 3:55 PM

## 2011-04-07 NOTE — Progress Notes (Signed)
   Subjective/Complaints: Patient denies any new problems today. He participated in rehabilitation yesterday. He is ambulating in his room with supervision assistance.  Review of Systems  Genitourinary: Positive for frequency.       Incomplete emptying  All other systems reviewed and are negative.    Objective: Vital Signs: Blood pressure 149/83, pulse 71, temperature 99 F (37.2 C), temperature source Oral, resp. rate 18, height 5\' 11"  (1.803 m), weight 66.6 kg (146 lb 13.2 oz), SpO2 99.00%. No results found. No results found for this or any previous visit (from the past 72 hour(s)).   HEENT: normal Cardio: RRR Resp: Lungs are clear to auscultation GI: Abdomen positive bowel sounds soft nontender to palpation Extremity: No clubbing cyanosis or edema Skin: No evidence of skin breakdown Neuro: Ataxia in the right upper greater than right lower extremity moderate with finger nose finger and heel/shin. This 4/5 in the right upper extremity and 5 out of 5 in the right lower extremity as well as 5 out of 5 on the left side Musc/Skel: No pain with active range of motion of the upper or lower extremities. Pain with ambulation. Patient ambulates with supervision assistance in the room   Assessment/Plan: 1. 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 Physiatrist is providing close team supervision and 24 hour management of active medical problems listed below. Physiatrist and rehab team continue to assess barriers to discharge/monitor patient progress toward functional and medical goals. Mobility: Bed Mobility Bed Mobility: Yes Supine to Sit: 7: Independent Transfers Transfers: Yes Sit to Stand: 6: Modified independent (Device/Increase time) (with RW) Stand Pivot Transfers: 5: Supervision Stand Pivot Transfer Details (indicate  cue type and reason): Decreeased coordination R LE Ambulation/Gait Ambulation/Gait Assistance: 5: Supervision Ambulation/Gait Assistance Details (indicate cue type and reason): Unlevel surfaces/sidewalk Ambulation Distance (Feet): 500 Feet Assistive device: Rolling walker Stairs: No Wheelchair Mobility Wheelchair Mobility: No  ADL:    Cognition:      2. Anticoagulation/DVT prophylaxis with Pharmaceutical: Lovenox 3. 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/04/2011)    POA: Yes   Assessment: Deficits stable    Plan: Continue rehabilitation program  Hypertension (04/04/2011)    POA: Yes   Assessment: Blood pressure well controlled    Plan: Continue Norvasc and hydrochlorothiazide  Alcohol abuse (04/04/2011)    POA: Yes   Assessment: Patient reports only 8 beers per week    Plan: Alcohol withdrawal l is unlikely given this amount  Tobacco abuse (04/04/2011)    POA: Yes   Assessment: No evidence of cravings    Plan: Continued NicoDerm  Urinary retention (04/04/2011)    POA: Yes   Assessment: Benign prostatic hypertrophy    Plan: Continue Flomax   5  Matthew Abbott E 04/07/2011, 7:35 AM

## 2011-04-08 NOTE — Patient Care Conference (Signed)
Inpatient RehabilitationTeam Conference Note Date: 04/08/2011   Time: 10:34 AM    Patient Name: Matthew Abbott      Medical Record Number: [id number]  Date of Birth: 1955/11/29 Sex: Male         Room/Bed: 4033/4033-01 Payor Info: Payor:     Admitting Diagnosis: RT CVA  Admit Date/Time:  04/02/2011  4:31 PM Admission Comments: No comment available   Primary Diagnosis:  Cerebellar infarct Principal Problem: Cerebellar infarct  Patient Active Problem List  Diagnoses Date Noted  . Cerebellar infarct 04/04/2011  . Hypertension 04/04/2011  . Alcohol abuse 04/04/2011  . Tobacco abuse 04/04/2011  . Urinary retention 04/04/2011    Team Members Present: Physician: Dr. Claudette Laws Case Manager Present: Lutricia Horsfall, RN Social Worker Present: Dossie Der, LCSW PT Present: Edman Circle, PT;Caroline Angelena Form, PT OT Present: Leonette Monarch, Felipa Eth, OT;Hilary Ward, OT     Current Status/Progress Goal Weekly Team Focus  Medical   Patient is medically stable.  Setup outpatient followup with both M.D. and therapy  Complete rehabilitation program and transition to outpatient   Bowel/Bladder   continent bowel/bladder  n/a  n/a   Swallow/Nutrition/ Hydration             ADL's     Mod I bathing and dsg, mod I toileting and toilet transfers   Mod I   increased safety, Rt UE neuro re ed  Mobility   mod I household ambulation with RW; S community ambulation with RW   mod I household ambulation with RW; S community ambulation with RW  increasing safety; D/C education   Communication             Safety/Cognition/ Behavioral Observations            Pain   n/a  n/a  n/a   Skin   n/a  n/a  n/a      *See Interdisciplinary Assessment and Plan and progress notes for long and short-term goals  Barriers to Discharge: Family supervision of higher-level activities.    Possible Resolutions to Barriers:  Family training today.    Discharge  Planning/Teaching Needs:  Home with daughter gone 2-3 hours a day. Short length of stay high level      Team discussion: Pt medically stable. Goals met. Team agrees pt ready for discharge today, 04/08/11. To have outpt therapy. Follow-up at [facility].

## 2011-04-08 NOTE — Discharge Summary (Signed)
Matthew Abbott, Matthew Abbott               ACCOUNT NO.:  [id number]  MEDICAL RECORD NO.:  [id number]  LOCATION:  4033                         FACILITY:  [facility]  PHYSICIAN:  Lacretia Leigh. Hatcher, M.D.DATE OF BIRTH:  04-05-1956  DATE OF ADMISSION:  04/02/2011 DATE OF DISCHARGE:  04/08/2011                              DISCHARGE SUMMARY   DISCHARGE DIAGNOSES: 1. 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 2. Hypertension. 3. Alcohol abuse. 4. Tobacco abuse.  DISCHARGE MEDICATIONS: 1. Aspirin 325 mg tablet by mouth daily. 2. Folic acid 1 mg tablet by mouth daily. 3. Hydrochlorothiazide 12.5 mg capsule by mouth daily. 4. Nicotine 14 mg/24 hour patch transdermally daily. 5. Thiamine 100 mg tablet by mouth daily.  DISPOSITION AND FOLLOW UP:  Mr. [name] will be discharged to Inpatient Rehabilitation at [facility].  The patient will likely need further rehabilitation at outside facility.  The patient is directed to call Outpatient [facility] at [phone] to make a followup appointment upon discharge from inpatient rehab.  Please monitor hypertension and response to recently prescribed 12.5 mg hydrochlorothiazide once he is on his home diet.  Encourage him to remain alcohol and tobacco free.  He is a large fall risk after his stroke and drinking alcohol is unacceptably risky at this point. Neurology, please follow outpatient for counseling on stroke risk modification.  The patient will schedule appointment upon discharge.  PROCEDURES PERFORMED: 1. CT head without contrast, March 29, 2011.  Impression:     Hypodensity, right superior cerebellum most likely an acute infarct     without hemorrhage.  Followup MRI is suggested. 2. MRI head with and without contrast, March 31, 2011.  Impression:     a.     Right superior cerebellar artery acute to subacute infarct.      No hemorrhage.  No significant mass effect.     b.     Superimposed occasional bilateral cerebellar acute  lacunar      infarcts.     c.     See MRA findings below. 3. MRA neck, March 31, 2011.  Impression:     a.     Dominant right vertebral artery is patent with mild proximal      irregularity but no significant stenosis.     b.     Nondominant left vertebral artery with mild origin stenosis      and terminates in pica.     c.     Mild carotid atherosclerosis without cervical carotid      stenosis.     d.     Intracranial findings are below. 4. MRA brain, March 31, 2011.  Impression:     a.     Right superior cerebellar artery origin is patent.  No      posterior circulation stenosis or major branch occlusion      identified.     b.     Tortuous anterior circulation, otherwise within normal      limits. 5. EKG, March 30, 2011:  Normal sinus rhythm, minimal voltage     criteria for LVH, maybe normal variant.  No previous tracing. 6. EKG, March 30, 2011:  Normal sinus rhythm,  possible left atrial     enlargement.  No significant change since last tracing. 7. Transthoracic echo, March 31, 2011.  Study conclusions:     a.     Left ventricle.  The cavity size was normal, wall thickness      was normal, systolic function was normal.  The estimated ejection      fraction was in the range of 50-55%.  Wall motion was normal.      There were no regional wall motion abnormalities.  Left      ventricular diastolic function parameters were normal.     b.     Atrial septum.  No defect or patent foramen ovale was      identified.     c.     Impression:  No cardiac source of emboli was identified. 8. Transesophageal echo, April 02, 2011.  Study conclusions:     a.     Left ventricle.  Systolic function was normal.  The      estimated ejection fraction was in the range of 55-60%.  Wall      motion was normal.  There were no regional wall motion      abnormalities.     b.     Mitral valve.  Mild regurgitation.     c.     Left atrium.  The atrium was mildly dilated.  No evidence of       thrombus in the atrial cavity or appendage.     d.     Atrial septum.  No defect or patent foramen ovale was      identified.     e.     Impression:  Negative saline and micro cavitation study.  CONSULTATIONS:  Neurology.  HISTORY OF PRESENT ILLNESS:  The patient is a 55 year old man with past medical history significant for hypertension who presents with 1-2 days of abnormal gait and confusion that has since been improving.  He reports that 1 day prior to admission, he was sitting on a porch, then all of a sudden felt something funny in his legs.  He needed assistance to stand up and it is unclear if he sought medical attention from the nurse at that time.  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  His story is difficult to piece together, but his daughter was able to help with details as she was called on the day of admission since her father had increased confusion and difficulty with gait and elevated blood sugars. The patient reports that on the day of admission, blood pressure was found to be 171/102 and then 168/103.  He was given 2 pills, daughter reports hydrochlorothiazide-lisinopril and phenergen and blood pressure decreased to 152/91.  He notes associated vomiting x3 after taking medications and reports nonbloody nor black vomitus to be yellow and only 1 nonbloody loose bowel movement on the day prior to arrival. He denies abdominal pain.  He notes tolerating soup given in the ED, but prior to that he had no food since having a doughnut 2 days before admission and he has not had an appetite.  Daughter describes confusion as calling her by the wrong name or not knowing where his belongings are, i.e. asking for his cell phone which was left at home purposely. Daughter also reports slurred speech on the day of arrival but has since resolved.  Finally, the patient notes increased blurry  vision in left eye, but  this has been ongoing for some months.  He reports that a branch hit his eye earlier in the summer and since then he has noticed discharge and crusting each morning which he has tried to treat with warm compresses upon suggestion by his daughter.  He denies acute vision changes over the last few days.  PHYSICAL EXAMINATION:  VITAL SIGNS:  Temperature 98.4, heart rate 97, blood pressure 135/81, respiratory rate 20, O2 saturation 97% on room air. GENERAL:  Resting in bed in no acute distress. HEENT:  Pupils are equally round and reactive to light.  Extraocular movements are intact.  No scleral icterus.  Bilateral conjunctival injection.  No conjunctival pallor. CARDIAC:  Regular rate and rhythm.  No rubs, murmurs, or gallops. Normal S1 and S2. PULMONARY:  Clear to auscultation bilaterally.  Moving normal volumes of air.  No wheezing, rales, or rhonchi. ABDOMEN:  Soft, mildly tender to palpation in the epigastric region, nondistended.  Bowel sounds are normoactive. EXTREMITIES:  Warm and well perfused.  No pedal edema. NEURO:  Alert and oriented x3 and to the [employer] [place] the [place]. Cranial nerves II through XII grossly intact.  Strength and sensation to light touch equal in bilateral upper and lower extremities.  Cerebellar function intact by finger-to-nose, heel-to-shin, and rapid alternating hand movements.  Unsteady, shuffling gait by both legs.  DTRs are grossly intact.  LABORATORY DATA:  Basic metabolic panel:  Sodium 130, potassium 4.5, chloride 89, bicarb 28, BUN 29, creatinine 1.03.  Glucose 105, calcium 11.1.  Liver function tests:  AST 33, ALT 34, alk phos 118, total bilirubin 1.2, protein 8.1, albumin 4.3, ammonia 23.  CBC:  White blood cell count 11.6, hemoglobin 17.4, hematocrit 46.7, MCV 82.4, platelets 157.  Blood acetone negative.  Urinalysis:  Color of urine, amber; appearance, cloudy; specific gravity 1.022, pH 5.0, urine glucose 100, bilirubin negative,  ketones 15, blood negative, protein negative, urobilinogen 0.2, nitrite negative, leukocytes negative.  HOSPITAL COURSE BY PROBLEM: 1. Right cerebellar stroke.  The patient was found to have a stroke on     CT and MRI imaging.  His risk factors were assessed and he does not     have obesity, hyperlipidemia, or diabetes.  He is      hypertensive in-house, and was not on medication outpatient although he has taken blood pressure pill 5-     6 years ago and was told he did not require them anymore.  The     patient was not on aspirin and was started on aspirin 325 mg.  We     will discharge him on April 02, 2011.  Since the patient is     relatively young and does not have risk factors, Neurology felt he     was at high risk for embolic source and they got a     hypercoagulability and vasculitic workup which revealed no clear     embolic source. 2. Hypertension.  The patient's pressures ranged between 120s-160s/60s-     80s.  We started him on low-dose hydrochlorothiazide and he will be     followed in inpatient rehab.  He will require followup with     his PCP. 3. Alcohol abuse.  The patient admits to drinking of 40-ounce beer     most days and he has a past DUI.  It was thought that he initially     presented with symptoms of alcohol withdrawal since he was somewhat  confused and nauseous.  This is a possibility, although the patient     had CIWA scores of 1 while in-house and did not require any Ativan. 4. Tobacco cessation.  The patient was counseled on the risks of     tobacco smoking and given strategies to aid in clinic.  He was     started on 14 mg nicotine patch and did not have any tobacco     craving.  DISCHARGE VITAL SIGNS:  Temperature 98.8, heart rate 65, blood pressure 165/94, respiratory rate 18, and O2 saturation 98% on room air.  DISCHARGE LABORATORY DATA:  Basic metabolic panel:  Sodium 134, potassium 3.5, chloride 97, bicarb 27, BUN 8, creatinine 0.64,  glucose 119, calcium 9.7, magnesium 1.5.    ______________________________ Vernice Jefferson, MD   ______________________________ Lacretia Leigh. Ninetta Lights, M.D.    NK/MEDQ  D:  04/08/2011  T:  04/08/2011  Job:  [id number]

## 2011-04-08 NOTE — Progress Notes (Signed)
Social Work Discharge Note Discharge Note  The overall goal for the admission was met for:   Discharge location: Yes-Home with daughter  Length of Stay: Yes-6 days  Discharge activity level: Yes-Mod/I level with rolling walker  Home/community participation: Yes  Services provided included: MD, RD, PT, OT, SLP, RN, CM, TR, Pharmacy and SW  Financial Services: Other: Pending Medicaid  Follow-up services arranged: Outpatient: [facility]-PT,OT 11/8 9;15-10;30  Comments (or additional information):Pt set up with [facility]-12/28 10;00 eligibility and MD appt-1/9 10;30--Dr Clelia Croft  Patient/Family verbalized understanding of follow-up arrangements: Yes  Individual responsible for coordination of the follow-up plan: Self and Daughter-Morgan  Confirmed correct DME delivered: Lucy Chris 04/08/2011  Yes  Danyel Tobey, Lemar Livings

## 2011-04-08 NOTE — Progress Notes (Signed)
Patient discharged to home with his daughter via wheelchair transport in satisfactory condition. Discharge instructions reviewed and given to patient by physician.

## 2011-04-08 NOTE — Progress Notes (Signed)
Subjective/Complaints: Patient denies any new problems today. He participated in rehabilitation yesterday. He is ambulating in his room with supervision assistance.   Review of Systems  Genitourinary: Positive for frequency.       Incomplete emptying  All other systems reviewed and are negative.    Objective: Vital Signs: Blood pressure 117/80, pulse 84, temperature 99.4 F (37.4 C), temperature source Oral, resp. rate 16, height 5\' 11"  (1.803 m), weight 64.2 kg (141 lb 8.6 oz), SpO2 99.00%. No results found. No results found for this or any previous visit (from the past 72 hour(s)).   HEENT: normal Cardio: RRR Resp: Lungs are clear to auscultation GI: Abdomen positive bowel sounds soft nontender to palpation Extremity: No clubbing cyanosis or edema Skin: No evidence of skin breakdown Neuro: Ataxia in the right upper greater than right lower extremity moderate with finger nose finger and heel/shin. This 4/5 in the right upper extremity and 5 out of 5 in the right lower extremity as well as 5 out of 5 on the left side Musc/Skel: No pain with active range of motion of the upper or lower extremities. Pain with ambulation. Patient ambulates with supervision assistance in the room   Assessment/Plan: 1. 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 Physiatrist is providing close team supervision and 24 hour management of active medical problems listed below. Physiatrist and rehab team continue to assess barriers to discharge/monitor patient progress toward functional and medical goals. Mobility: Bed Mobility Bed Mobility: Yes Supine to Sit: 6: Modified independent (Device/Increase time) Transfers Transfers: Yes Sit to Stand: 6: Modified independent (Device/Increase time) Stand Pivot Transfers: 6: Modified independent  (Device/Increase time) Stand Pivot Transfer Details (indicate cue type and reason): Decreeased coordination R LE Ambulation/Gait Ambulation/Gait Assistance: 6: Modified independent (Device/Increase time) Ambulation/Gait Assistance Details (indicate cue type and reason): mildly ataxic RLE Ambulation Distance (Feet): 160 Feet Assistive device: Rolling walker Stairs: Yes Stairs Assistance: 6: Modified independent (Device/Increase time) Stair Management Technique: One rail Left Number of Stairs: 12  Wheelchair Mobility Wheelchair Mobility: No  ADL:    Cognition: Cognition Overall Cognitive Status: Appears within functional limits for tasks assessed Arousal/Alertness: Awake/alert Orientation Level: Oriented X4 Attention: Sustained Sustained Attention: Appears intact Memory: Appears intact Awareness: Impaired Awareness Impairment: Anticipatory impairment Problem Solving: Appears intact Safety/Judgment: Impaired (impulsive and quick movements, same as PTA) Cognition Arousal/Alertness: Awake/alert Orientation Level: Oriented X4  2. Anticoagulation/DVT prophylaxis.Lovenox discontinued today. 3. Pain Management: No issues     Patient Active Hospital Problem List: 4. Cerebellar infarct (04/04/2011)    POA: Yes   Assessment: Deficits stable    Plan: Continue rehabilitation program  5. Hypertension (04/04/2011)    POA: Yes   Assessment: Blood pressure well controlled    Plan: Continue Norvasc and hydrochlorothiazide.Plan follow up with [facility]. 6. Alcohol abuse (04/04/2011)    POA: Yes   Assessment: Patient reports only 8 beers per week    Plan: Alcohol withdrawal l is unlikely given this amount. Received full counseling in regards to no alcohol. 7. Tobacco abuse (04/04/2011)    POA: Yes   Assessment: No evidence of cravings    Plan: discontinue nicoderm patch due to cost . Discussed all issues of health concerns related to smoking.  8. Urinary retention (04/04/2011)    POA: Yes    Assessment: Benign prostatic hypertrophy    Plan: Continue Flomax. Prescription provided. 9. Discharge  home today 04/08/2011 See discharge summary   ANGIULLI,DANIEL J. 04/08/2011, 6:58 AM

## 2011-04-09 ENCOUNTER — Ambulatory Visit: Payer: Self-pay | Admitting: Physical Therapy

## 2011-04-13 ENCOUNTER — Ambulatory Visit: Payer: Self-pay | Admitting: Occupational Therapy

## 2011-04-22 ENCOUNTER — Ambulatory Visit: Payer: Self-pay | Attending: Physical Medicine & Rehabilitation | Admitting: Occupational Therapy

## 2011-04-22 DIAGNOSIS — R279 Unspecified lack of coordination: Secondary | ICD-10-CM | POA: Insufficient documentation

## 2011-04-22 DIAGNOSIS — IMO0001 Reserved for inherently not codable concepts without codable children: Secondary | ICD-10-CM | POA: Insufficient documentation

## 2011-04-28 ENCOUNTER — Encounter: Payer: Self-pay | Admitting: *Deleted

## 2011-04-30 ENCOUNTER — Encounter: Payer: Self-pay | Admitting: Occupational Therapy

## 2011-05-04 ENCOUNTER — Encounter: Payer: Self-pay | Admitting: Occupational Therapy

## 2011-05-04 ENCOUNTER — Ambulatory Visit: Payer: Self-pay | Admitting: Physical Therapy

## 2011-05-06 ENCOUNTER — Encounter: Payer: Self-pay | Admitting: Occupational Therapy

## 2011-05-06 ENCOUNTER — Ambulatory Visit: Payer: Self-pay | Admitting: Physical Therapy

## 2011-05-07 ENCOUNTER — Inpatient Hospital Stay: Payer: Self-pay | Admitting: Physical Medicine & Rehabilitation

## 2011-05-11 ENCOUNTER — Encounter: Payer: Self-pay | Admitting: Occupational Therapy

## 2011-05-11 ENCOUNTER — Ambulatory Visit: Payer: Self-pay | Admitting: Physical Therapy

## 2011-05-13 ENCOUNTER — Encounter: Payer: Self-pay | Admitting: Occupational Therapy

## 2011-05-13 ENCOUNTER — Ambulatory Visit: Payer: Self-pay | Admitting: Physical Therapy

## 2011-05-18 ENCOUNTER — Ambulatory Visit: Payer: Self-pay | Admitting: Physical Therapy

## 2011-05-18 ENCOUNTER — Encounter: Payer: Self-pay | Admitting: Occupational Therapy

## 2011-05-21 ENCOUNTER — Ambulatory Visit: Payer: Self-pay | Admitting: Physical Therapy

## 2011-05-21 ENCOUNTER — Encounter: Payer: Self-pay | Admitting: Occupational Therapy

## 2011-05-29 ENCOUNTER — Ambulatory Visit: Payer: Self-pay | Admitting: Physical Therapy

## 2011-06-08 ENCOUNTER — Inpatient Hospital Stay: Payer: Self-pay | Admitting: Physical Medicine & Rehabilitation

## 2011-07-02 ENCOUNTER — Inpatient Hospital Stay: Payer: Self-pay | Admitting: Physical Medicine & Rehabilitation

## 2011-07-02 ENCOUNTER — Encounter: Payer: Self-pay | Attending: Physical Medicine & Rehabilitation

## 2011-07-30 ENCOUNTER — Ambulatory Visit (HOSPITAL_BASED_OUTPATIENT_CLINIC_OR_DEPARTMENT_OTHER): Payer: Self-pay | Admitting: Physical Medicine & Rehabilitation

## 2011-07-30 ENCOUNTER — Encounter: Payer: Self-pay | Admitting: Physical Medicine & Rehabilitation

## 2011-07-30 ENCOUNTER — Encounter: Payer: Self-pay | Attending: Physical Medicine & Rehabilitation

## 2011-07-30 DIAGNOSIS — M25519 Pain in unspecified shoulder: Secondary | ICD-10-CM | POA: Insufficient documentation

## 2011-07-30 DIAGNOSIS — I69993 Ataxia following unspecified cerebrovascular disease: Secondary | ICD-10-CM | POA: Insufficient documentation

## 2011-07-30 DIAGNOSIS — IMO0002 Reserved for concepts with insufficient information to code with codable children: Secondary | ICD-10-CM | POA: Insufficient documentation

## 2011-07-30 DIAGNOSIS — M75100 Unspecified rotator cuff tear or rupture of unspecified shoulder, not specified as traumatic: Secondary | ICD-10-CM

## 2011-07-30 NOTE — Patient Instructions (Signed)
Impingement Syndrome, Rotator Cuff, Bursitis with Rehab Impingement syndrome is a condition that involves inflammation of the tendons of the rotator cuff and the subacromial bursa, that causes pain in the shoulder. The rotator cuff consists of four tendons and muscles that control much of the shoulder and upper arm function. The subacromial bursa is a fluid filled sac that helps reduce friction between the rotator cuff and one of the bones of the shoulder (acromion). Impingement syndrome is usually an overuse injury that causes swelling of the bursa (bursitis), swelling of the tendon (tendonitis), and/or a tear of the tendon (strain). Strains are classified into three categories. Grade 1 strains cause pain, but the tendon is not lengthened. Grade 2 strains include a lengthened ligament, due to the ligament being stretched or partially ruptured. With grade 2 strains there is still function, although the function may be decreased. Grade 3 strains include a complete tear of the tendon or muscle, and function is usually impaired. SYMPTOMS   Pain around the shoulder, often at the outer portion of the upper arm.   Pain that gets worse with shoulder function, especially when reaching overhead or lifting.   Sometimes, aching when not using the arm.   Pain that wakes you up at night.   Sometimes, tenderness, swelling, warmth, or redness over the affected area.   Loss of strength.   Limited motion of the shoulder, especially reaching behind the back (to the back pocket or to unhook bra) or across your body.   Crackling sound (crepitation) when moving the arm.   Biceps tendon pain and inflammation (in the front of the shoulder). Worse when bending the elbow or lifting.  CAUSES  Impingement syndrome is often an overuse injury, in which chronic (repetitive) motions cause the tendons or bursa to become inflamed. A strain occurs when a force is paced on the tendon or muscle that is greater than it can  withstand. Common mechanisms of injury include: Stress from sudden increase in duration, frequency, or intensity of training.  Direct hit (trauma) to the shoulder.   Aging, erosion of the tendon with normal use.   Bony bump on shoulder (acromial spur).  RISK INCREASES WITH:  Contact sports (football, wrestling, boxing).   Throwing sports (baseball, tennis, volleyball).   Weightlifting and bodybuilding.   Heavy labor.   Previous injury to the rotator cuff, including impingement.   Poor shoulder strength and flexibility.   Failure to warm up properly before activity.   Inadequate protective equipment.   Old age.   Bony bump on shoulder (acromial spur).  PREVENTION   Warm up and stretch properly before activity.   Allow for adequate recovery between workouts.   Maintain physical fitness:   Strength, flexibility, and endurance.   Cardiovascular fitness.   Learn and use proper exercise technique.  PROGNOSIS  If treated properly, impingement syndrome usually goes away within 6 weeks. Sometimes surgery is required.  RELATED COMPLICATIONS   Longer healing time if not properly treated, or if not given enough time to heal.   Recurring symptoms, that result in a chronic condition.   Shoulder stiffness, frozen shoulder, or loss of motion.   Rotator cuff tendon tear.   Recurring symptoms, especially if activity is resumed too soon, with overuse, with a direct blow, or when using poor technique.  TREATMENT  Treatment first involves the use of ice and medicine, to reduce pain and inflammation. The use of strengthening and stretching exercises may help reduce pain with activity. These exercises may   be performed at home or with a therapist. If non-surgical treatment is unsuccessful after more than 6 months, surgery may be advised. After surgery and rehabilitation, activity is usually possible in 3 months.  MEDICATION  If pain medicine is needed, nonsteroidal  anti-inflammatory medicines (aspirin and ibuprofen), or other minor pain relievers (acetaminophen), are often advised.   Do not take pain medicine for 7 days before surgery.   Prescription pain relievers may be given, if your caregiver thinks they are needed. Use only as directed and only as much as you need.   Corticosteroid injections may be given by your caregiver. These injections should be reserved for the most serious cases, because they may only be given a certain number of times.  HEAT AND COLD  Cold treatment (icing) should be applied for 10 to 15 minutes every 2 to 3 hours for inflammation and pain, and immediately after activity that aggravates your symptoms. Use ice packs or an ice massage.   Heat treatment may be used before performing stretching and strengthening activities prescribed by your caregiver, physical therapist, or athletic trainer. Use a heat pack or a warm water soak.  SEEK MEDICAL CARE IF:   Symptoms get worse or do not improve in 4 to 6 weeks, despite treatment.   New, unexplained symptoms develop. (Drugs used in treatment may produce side effects.)  EXERCISES  RANGE OF MOTION (ROM) AND STRETCHING EXERCISES - Impingement Syndrome (Rotator Cuff  Tendinitis, Bursitis) These exercises may help you when beginning to rehabilitate your injury. Your symptoms may go away with or without further involvement from your physician, physical therapist or athletic trainer. While completing these exercises, remember:   Restoring tissue flexibility helps normal motion to return to the joints. This allows healthier, less painful movement and activity.   An effective stretch should be held for at least 30 seconds.   A stretch should never be painful. You should only feel a gentle lengthening or release in the stretched tissue.  STRETCH - Flexion, Standing  Stand with good posture. With an underhand grip on your right / left hand, and an overhand grip on the opposite hand, grasp  a broomstick or cane so that your hands are a little more than shoulder width apart.   Keeping your right / left elbow straight and shoulder muscles relaxed, push the stick with your opposite hand, to raise your right / left arm in front of your body and then overhead. Raise your arm until you feel a stretch in your right / left shoulder, but before you have increased shoulder pain.   Try to avoid shrugging your right / left shoulder as your arm rises, by keeping your shoulder blade tucked down and toward your mid-back spine. Hold for __________ seconds.   Slowly return to the starting position.  Repeat __________ times. Complete this exercise __________ times per day. STRETCH - Abduction, Supine  Lie on your back. With an underhand grip on your right / left hand and an overhand grip on the opposite hand, grasp a broomstick or cane so that your hands are a little more than shoulder width apart.   Keeping your right / left elbow straight and your shoulder muscles relaxed, push the stick with your opposite hand, to raise your right / left arm out to the side of your body and then overhead. Raise your arm until you feel a stretch in your right / left shoulder, but before you have increased shoulder pain.   Try to avoid shrugging  your right / left shoulder as your arm rises, by keeping your shoulder blade tucked down and toward your mid-back spine. Hold for __________ seconds.   Slowly return to the starting position.  Repeat __________ times. Complete this exercise __________ times per day. ROM - Flexion, Active-Assisted  Lie on your back. You may bend your knees for comfort.   Grasp a broomstick or cane so your hands are about shoulder width apart. Your right / left hand should grip the end of the stick, so that your hand is positioned "thumbs-up," as if you were about to shake hands.   Using your healthy arm to lead, raise your right / left arm overhead, until you feel a gentle stretch in your  shoulder. Hold for __________ seconds.   Use the stick to assist in returning your right / left arm to its starting position.  Repeat __________ times. Complete this exercise __________ times per day.  ROM - Internal Rotation, Supine   Lie on your back on a firm surface. Place your right / left elbow about 60 degrees away from your side. Elevate your elbow with a folded towel, so that the elbow and shoulder are the same height.   Using a broomstick or cane and your strong arm, pull your right / left hand toward your body until you feel a gentle stretch, but no increase in your shoulder pain. Keep your shoulder and elbow in place throughout the exercise.   Hold for __________ seconds. Slowly return to the starting position.  Repeat __________ times. Complete this exercise __________ times per day. STRETCH - Internal Rotation  Place your right / left hand behind your back, palm up.   Throw a towel or belt over your opposite shoulder. Grasp the towel with your right / left hand.   While keeping an upright posture, gently pull up on the towel, until you feel a stretch in the front of your right / left shoulder.   Avoid shrugging your right / left shoulder as your arm rises, by keeping your shoulder blade tucked down and toward your mid-back spine.   Hold for __________ seconds. Release the stretch, by lowering your healthy hand.  Repeat __________ times. Complete this exercise __________ times per day. ROM - Internal Rotation   Using an underhand grip, grasp a stick behind your back with both hands.   While standing upright with good posture, slide the stick up your back until you feel a mild stretch in the front of your shoulder.   Hold for __________ seconds. Slowly return to your starting position.  Repeat __________ times. Complete this exercise __________ times per day.  STRETCH - Posterior Shoulder Capsule   Stand or sit with good posture. Grasp your right / left elbow and draw it  across your chest, keeping it at the same height as your shoulder.   Pull your elbow, so your upper arm comes in closer to your chest. Pull until you feel a gentle stretch in the back of your shoulder.   Hold for __________ seconds.  Repeat __________ times. Complete this exercise __________ times per day. STRENGTHENING EXERCISES - Impingement Syndrome (Rotator Cuff Tendinitis, Bursitis) These exercises may help you when beginning to rehabilitate your injury. They may resolve your symptoms with or without further involvement from your physician, physical therapist or athletic trainer. While completing these exercises, remember:  Muscles can gain both the endurance and the strength needed for everyday activities through controlled exercises.   Complete these exercises as   instructed by your physician, physical therapist or athletic trainer. Increase the resistance and repetitions only as guided.   You may experience muscle soreness or fatigue, but the pain or discomfort you are trying to eliminate should never worsen during these exercises. If this pain does get worse, stop and make sure you are following the directions exactly. If the pain is still present after adjustments, discontinue the exercise until you can discuss the trouble with your clinician.   During your recovery, avoid activity or exercises which involve actions that place your injured hand or elbow above your head or behind your back or head. These positions stress the tissues which you are trying to heal.  STRENGTH - Scapular Depression and Adduction   With good posture, sit on a firm chair. Support your arms in front of you, with pillows, arm rests, or on a table top. Have your elbows in line with the sides of your body.   Gently draw your shoulder blades down and toward your mid-back spine. Gradually increase the tension, without tensing the muscles along the top of your shoulders and the back of your neck.   Hold for  __________ seconds. Slowly release the tension and relax your muscles completely before starting the next repetition.   After you have practiced this exercise, remove the arm support and complete the exercise in standing as well as sitting position.  Repeat __________ times. Complete this exercise __________ times per day.  STRENGTH - Shoulder Abductors, Isometric  With good posture, stand or sit about 4-6 inches from a wall, with your right / left side facing the wall.   Bend your right / left elbow. Gently press your right / left elbow into the wall. Increase the pressure gradually, until you are pressing as hard as you can, without shrugging your shoulder or increasing any shoulder discomfort.   Hold for __________ seconds.   Release the tension slowly. Relax your shoulder muscles completely before you begin the next repetition.  Repeat __________ times. Complete this exercise __________ times per day.  STRENGTH - External Rotators, Isometric  Keep your right / left elbow at your side and bend it 90 degrees.   Step into a door frame so that the outside of your right / left wrist can press against the door frame without your upper arm leaving your side.   Gently press your right / left wrist into the door frame, as if you were trying to swing the back of your hand away from your stomach. Gradually increase the tension, until you are pressing as hard as you can, without shrugging your shoulder or increasing any shoulder discomfort.   Hold for __________ seconds.   Release the tension slowly. Relax your shoulder muscles completely before you begin the next repetition.  Repeat __________ times. Complete this exercise __________ times per day.  STRENGTH - Supraspinatus   Stand or sit with good posture. Grasp a __________ weight, or an exercise band or tubing, so that your hand is "thumbs-up," like you are shaking hands.   Slowly lift your right / left arm in a "V" away from your thigh,  diagonally into the space between your side and straight ahead. Lift your hand to shoulder height or as far as you can, without increasing any shoulder pain. At first, many people do not lift their hands above shoulder height.   Avoid shrugging your right / left shoulder as your arm rises, by keeping your shoulder blade tucked down and toward your mid-back   spine.   Hold for __________ seconds. Control the descent of your hand, as you slowly return to your starting position.  Repeat __________ times. Complete this exercise __________ times per day.  STRENGTH - External Rotators  Secure a rubber exercise band or tubing to a fixed object (table, pole) so that it is at the same height as your right / left elbow when you are standing or sitting on a firm surface.   Stand or sit so that the secured exercise band is at your uninjured side.   Bend your right / left elbow 90 degrees. Place a folded towel or small pillow under your right / left arm, so that your elbow is a few inches away from your side.   Keeping the tension on the exercise band, pull it away from your body, as if pivoting on your elbow. Be sure to keep your body steady, so that the movement is coming only from your rotating shoulder.   Hold for __________ seconds. Release the tension in a controlled manner, as you return to the starting position.  Repeat __________ times. Complete this exercise __________ times per day.  STRENGTH - Internal Rotators   Secure a rubber exercise band or tubing to a fixed object (table, pole) so that it is at the same height as your right / left elbow when you are standing or sitting on a firm surface.   Stand or sit so that the secured exercise band is at your right / left side.   Bend your elbow 90 degrees. Place a folded towel or small pillow under your right / left arm so that your elbow is a few inches away from your side.   Keeping the tension on the exercise band, pull it across your body,  toward your stomach. Be sure to keep your body steady, so that the movement is coming only from your rotating shoulder.   Hold for __________ seconds. Release the tension in a controlled manner, as you return to the starting position.  Repeat __________ times. Complete this exercise __________ times per day.  STRENGTH - Scapular Protractors, Standing   Stand arms length away from a wall. Place your hands on the wall, keeping your elbows straight.   Begin by dropping your shoulder blades down and toward your mid-back spine.   To strengthen your protractors, keep your shoulder blades down, but slide them forward on your rib cage. It will feel as if you are lifting the back of your rib cage away from the wall. This is a subtle motion and can be challenging to complete. Ask your caregiver for further instruction, if you are not sure you are doing the exercise correctly.   Hold for __________ seconds. Slowly return to the starting position, resting the muscles completely before starting the next repetition.  Repeat __________ times. Complete this exercise __________ times per day. STRENGTH - Scapular Protractors, Supine  Lie on your back on a firm surface. Extend your right / left arm straight into the air while holding a __________ weight in your hand.   Keeping your head and back in place, lift your shoulder off the floor.   Hold for __________ seconds. Slowly return to the starting position, and allow your muscles to relax completely before starting the next repetition.  Repeat __________ times. Complete this exercise __________ times per day. STRENGTH - Scapular Protractors, Quadruped  Get onto your hands and knees, with your shoulders directly over your hands (or as close as you can   be, comfortably).   Keeping your elbows locked, lift the back of your rib cage up into your shoulder blades, so your mid-back rounds out. Keep your neck muscles relaxed.   Hold this position for __________  seconds. Slowly return to the starting position and allow your muscles to relax completely before starting the next repetition.  Repeat __________ times. Complete this exercise __________ times per day.  STRENGTH - Scapular Retractors  Secure a rubber exercise band or tubing to a fixed object (table, pole), so that it is at the height of your shoulders when you are either standing, or sitting on a firm armless chair.   With a palm down grip, grasp an end of the band in each hand. Straighten your elbows and lift your hands straight in front of you, at shoulder height. Step back, away from the secured end of the band, until it becomes tense.   Squeezing your shoulder blades together, draw your elbows back toward your sides, as you bend them. Keep your upper arms lifted away from your body throughout the exercise.   Hold for __________ seconds. Slowly ease the tension on the band, as you reverse the directions and return to the starting position.  Repeat __________ times. Complete this exercise __________ times per day. STRENGTH - Shoulder Extensors   Secure a rubber exercise band or tubing to a fixed object (table, pole) so that it is at the height of your shoulders when you are either standing, or sitting on a firm armless chair.   With a thumbs-up grip, grasp an end of the band in each hand. Straighten your elbows and lift your hands straight in front of you, at shoulder height. Step back, away from the secured end of the band, until it becomes tense.   Squeezing your shoulder blades together, pull your hands down to the sides of your thighs. Do not allow your hands to go behind you.   Hold for __________ seconds. Slowly ease the tension on the band, as you reverse the directions and return to the starting position.  Repeat __________ times. Complete this exercise __________ times per day.  STRENGTH - Scapular Retractors and External Rotators   Secure a rubber exercise band or tubing to a  fixed object (table, pole) so that it is at the height as your shoulders, when you are either standing, or sitting on a firm armless chair.   With a palm down grip, grasp an end of the band in each hand. Bend your elbows 90 degrees and lift your elbows to shoulder height, at your sides. Step back, away from the secured end of the band, until it becomes tense.   Squeezing your shoulder blades together, rotate your shoulders so that your upper arms and elbows remain stationary, but your fists travel upward to head height.   Hold for __________ seconds. Slowly ease the tension on the band, as you reverse the directions and return to the starting position.  Repeat __________ times. Complete this exercise __________ times per day.  STRENGTH - Scapular Retractors and External Rotators, Rowing   Secure a rubber exercise band or tubing to a fixed object (table, pole) so that it is at the height of your shoulders, when you are either standing, or sitting on a firm armless chair.   With a palm down grip, grasp an end of the band in each hand. Straighten your elbows and lift your hands straight in front of you, at shoulder height. Step back, away from the   secured end of the band, until it becomes tense.   Step 1: Squeeze your shoulder blades together. Bending your elbows, draw your hands to your chest, as if you are rowing a boat. At the end of this motion, your hands and elbow should be at shoulder height and your elbows should be out to your sides.   Step 2: Rotate your shoulders, to raise your hands above your head. Your forearms should be vertical and your upper arms should be horizontal.   Hold for __________ seconds. Slowly ease the tension on the band, as you reverse the directions and return to the starting position.  Repeat __________ times. Complete this exercise __________ times per day.  STRENGTH - Scapular Depressors  Find a sturdy chair without wheels, such as a dining room chair.    Keeping your feet on the floor, and your hands on the chair arms, lift your bottom up from the seat, and lock your elbows.   Keeping your elbows straight, allow gravity to pull your body weight down. Your shoulders will rise toward your ears.   Raise your body against gravity by drawing your shoulder blades down your back, shortening the distance between your shoulders and ears. Although your feet should always maintain contact with the floor, your feet should progressively support less body weight, as you get stronger.   Hold for __________ seconds. In a controlled and slow manner, lower your body weight to begin the next repetition.  Repeat __________ times. Complete this exercise __________ times per day.  Document Released: 05/18/2005 Document Revised: 01/28/2011 Document Reviewed: 08/30/2008 [facility] Patient Information 2012 [place], [place].

## 2011-07-30 NOTE — Progress Notes (Signed)
Subjective:    Patient ID: Matthew Abbott, male    DOB: 03-Jan-1956, 56 y.o.   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 He went through the inpatient rehabilitation program at [facility]. He has received outpatient therapy with PT and OT at [facility] neuro rehabilitation. Pain Inventory Average Pain 0 Pain Right Now 0 My pain is intermittent and aching  In the last 24 hours, has pain interfered with the following? General activity 0 Relation with others 0 Enjoyment of life 0 What TIME of day is your pain at its worst? evening Sleep (in general) Poor  Pain is worse with: standing Pain improves with: medication Relief from Meds: 2  Mobility walk without assistance how many minutes can you walk? 10 ability to climb steps?  yes do you drive?  no  Function not employed: date last employed 2010  Neuro/Psych bladder control problems weakness tremor tingling trouble walking dizziness  Prior Studies CT/MRI  Physicians involved in your care Primary care [facility]        Review of Systems  Constitutional:       Weight gain because he stopped drinking alcohol and decreased cigarettes  Eyes: Negative.   Respiratory: Negative.   Cardiovascular: Negative.   Gastrointestinal: Negative.   Genitourinary: Positive for decreased urine volume.       On Flomax for incomplete bladder emptying, frequency r/t this  Skin: Negative.   Neurological: Positive for dizziness, speech difficulty, weakness and headaches.  Hematological: Negative.   Psychiatric/Behavioral: Positive for sleep disturbance.       Objective:   Physical Exam  Constitutional: He is oriented to person, place, and time. He appears well-developed and well-nourished.  HENT:  Head: Normocephalic and atraumatic.  Eyes: Pupils are equal, round, and reactive to light.  Neck: Normal range of motion.    Cardiovascular: Normal rate, regular rhythm and normal heart sounds.   Pulmonary/Chest: Effort normal and breath sounds normal.  Abdominal: Soft. Bowel sounds are normal.  Neurological: He is alert and oriented to person, place, and time. He displays tremor. No cranial nerve deficit or sensory deficit. He displays a negative Romberg sign. Coordination and gait abnormal.  Reflex Scores:      Tricep reflexes are 0 on the right side and 0 on the left side.      Bicep reflexes are 0 on the right side and 0 on the left side.      Brachioradialis reflexes are 0 on the right side and 0 on the left side.      Patellar reflexes are 1+ on the right side and 3+ on the left side.      Achilles reflexes are 1+ on the right side and 3+ on the left side.      Occultly with rapid alternating movements in the right upper extremity. He has an intention tremor in the right arm Strength in the right deltoid is 3 minus with limited active range of motion. Biceps triceps and grip are graded at 4/5 both lower extremities are 5/5 in the left upper is 5/5  Psychiatric: He has a normal mood and affect. His behavior is normal. Judgment and thought content normal.   Right shoulder with impingement sign. Her is winging of the right scapula.       A mini in the upper extremity is a ssessment & Plan:  #1. 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 Poststroke shoulder pain on the right side. I suspect a rotator cuff tear. We discussed treatment options including other diagnostic testing like musculoskeletal ultrasound. We discussed that is unlikely that a injection with have any long-term benefits. I asked him to try some Tylenol at home 4 he goes to bed at night . I have also given him rotator cuff exercises Followup with me on a when necessary basis. This is if he elects to get a shoulder injection. This would also be for shoulder ultrasound if he desires

## 2011-08-20 ENCOUNTER — Encounter: Payer: Self-pay | Admitting: Physical Medicine & Rehabilitation

## 2011-09-22 ENCOUNTER — Telehealth: Payer: Self-pay

## 2011-09-22 NOTE — Telephone Encounter (Signed)
Pt request call back [phone]

## 2011-09-22 NOTE — Telephone Encounter (Signed)
I called pt to help him and he states that everything has been taken care of.

## 2012-11-03 ENCOUNTER — Other Ambulatory Visit (HOSPITAL_COMMUNITY): Payer: Self-pay | Admitting: Internal Medicine

## 2012-11-03 DIAGNOSIS — R0989 Other specified symptoms and signs involving the circulatory and respiratory systems: Secondary | ICD-10-CM

## 2012-11-09 ENCOUNTER — Ambulatory Visit (HOSPITAL_COMMUNITY)
Admission: RE | Admit: 2012-11-09 | Discharge: 2012-11-09 | Disposition: A | Discharge status: Home / Self Care | Payer: No Typology Code available for payment source | Source: Ambulatory Visit | Attending: Internal Medicine | Admitting: Internal Medicine

## 2012-11-09 DIAGNOSIS — Z8673 Personal history of transient ischemic attack (TIA), and cerebral infarction without residual deficits: Secondary | ICD-10-CM | POA: Insufficient documentation

## 2012-11-09 DIAGNOSIS — I10 Essential (primary) hypertension: Secondary | ICD-10-CM | POA: Insufficient documentation

## 2012-11-09 DIAGNOSIS — R0989 Other specified symptoms and signs involving the circulatory and respiratory systems: Secondary | ICD-10-CM | POA: Insufficient documentation

## 2012-11-09 DIAGNOSIS — F17200 Nicotine dependence, unspecified, uncomplicated: Secondary | ICD-10-CM | POA: Insufficient documentation

## 2012-11-09 NOTE — Progress Notes (Addendum)
*  PRELIMINARY RESULTS* Vascular Ultrasound Carotid Duplex (Doppler) has been completed.    Right = 40-59% ICA stenosis. Moderate to severe ECA stenosis.  Left = <39% ICA stenosis.  Antegrade vertebral flow.    Farrel Demark, RDMS, RVT  11/09/2012, 11:09 AM

## 2013-07-04 ENCOUNTER — Encounter (HOSPITAL_COMMUNITY): Payer: Self-pay | Admitting: Emergency Medicine

## 2013-07-04 ENCOUNTER — Inpatient Hospital Stay (HOSPITAL_COMMUNITY): Payer: Medicaid Other

## 2013-07-04 ENCOUNTER — Emergency Department (HOSPITAL_COMMUNITY): Payer: Medicaid Other

## 2013-07-04 ENCOUNTER — Inpatient Hospital Stay (HOSPITAL_COMMUNITY)
Admission: EM | Admit: 2013-07-04 | Discharge: 2013-07-07 | DRG: 682 | Disposition: A | Discharge status: Home / Self Care | Payer: Medicaid Other | Attending: Internal Medicine | Admitting: Internal Medicine

## 2013-07-04 DIAGNOSIS — R112 Nausea with vomiting, unspecified: Secondary | ICD-10-CM

## 2013-07-04 DIAGNOSIS — N3289 Other specified disorders of bladder: Secondary | ICD-10-CM | POA: Diagnosis present

## 2013-07-04 DIAGNOSIS — Z7982 Long term (current) use of aspirin: Secondary | ICD-10-CM

## 2013-07-04 DIAGNOSIS — R197 Diarrhea, unspecified: Secondary | ICD-10-CM

## 2013-07-04 DIAGNOSIS — I639 Cerebral infarction, unspecified: Secondary | ICD-10-CM

## 2013-07-04 DIAGNOSIS — E875 Hyperkalemia: Secondary | ICD-10-CM | POA: Diagnosis present

## 2013-07-04 DIAGNOSIS — M75100 Unspecified rotator cuff tear or rupture of unspecified shoulder, not specified as traumatic: Secondary | ICD-10-CM

## 2013-07-04 DIAGNOSIS — F1010 Alcohol abuse, uncomplicated: Secondary | ICD-10-CM

## 2013-07-04 DIAGNOSIS — F17200 Nicotine dependence, unspecified, uncomplicated: Secondary | ICD-10-CM | POA: Diagnosis present

## 2013-07-04 DIAGNOSIS — I69993 Ataxia following unspecified cerebrovascular disease: Secondary | ICD-10-CM

## 2013-07-04 DIAGNOSIS — I10 Essential (primary) hypertension: Secondary | ICD-10-CM | POA: Diagnosis present

## 2013-07-04 DIAGNOSIS — J189 Pneumonia, unspecified organism: Secondary | ICD-10-CM | POA: Diagnosis present

## 2013-07-04 DIAGNOSIS — Z720 Tobacco use: Secondary | ICD-10-CM

## 2013-07-04 DIAGNOSIS — N179 Acute kidney failure, unspecified: Principal | ICD-10-CM | POA: Diagnosis present

## 2013-07-04 DIAGNOSIS — E860 Dehydration: Secondary | ICD-10-CM | POA: Diagnosis present

## 2013-07-04 DIAGNOSIS — IMO0002 Reserved for concepts with insufficient information to code with codable children: Secondary | ICD-10-CM

## 2013-07-04 DIAGNOSIS — R339 Retention of urine, unspecified: Secondary | ICD-10-CM | POA: Diagnosis present

## 2013-07-04 DIAGNOSIS — M25519 Pain in unspecified shoulder: Secondary | ICD-10-CM

## 2013-07-04 DIAGNOSIS — R9431 Abnormal electrocardiogram [ECG] [EKG]: Secondary | ICD-10-CM

## 2013-07-04 DIAGNOSIS — Z8673 Personal history of transient ischemic attack (TIA), and cerebral infarction without residual deficits: Secondary | ICD-10-CM | POA: Diagnosis not present

## 2013-07-04 HISTORY — DX: Cerebral infarction, unspecified: I63.9

## 2013-07-04 LAB — URINALYSIS, ROUTINE W REFLEX MICROSCOPIC
Bilirubin Urine: NEGATIVE
Glucose, UA: 250 mg/dL — AB
KETONES UR: 15 mg/dL — AB
LEUKOCYTES UA: NEGATIVE
Nitrite: NEGATIVE
PROTEIN: 30 mg/dL — AB
Specific Gravity, Urine: 1.016 (ref 1.005–1.030)
Urobilinogen, UA: 0.2 mg/dL (ref 0.0–1.0)
pH: 5 (ref 5.0–8.0)

## 2013-07-04 LAB — COMPREHENSIVE METABOLIC PANEL
ALK PHOS: 124 U/L — AB (ref 39–117)
ALT: 20 U/L (ref 0–53)
AST: 44 U/L — ABNORMAL HIGH (ref 0–37)
Albumin: 4.3 g/dL (ref 3.5–5.2)
BILIRUBIN TOTAL: 0.6 mg/dL (ref 0.3–1.2)
BUN: 58 mg/dL — AB (ref 6–23)
CHLORIDE: 90 meq/L — AB (ref 96–112)
CO2: 21 mEq/L (ref 19–32)
Calcium: 9 mg/dL (ref 8.4–10.5)
Creatinine, Ser: 4.36 mg/dL — ABNORMAL HIGH (ref 0.50–1.35)
GFR calc non Af Amer: 14 mL/min — ABNORMAL LOW (ref >90)
GFR, EST AFRICAN AMERICAN: 16 mL/min — AB (ref >90)
GLUCOSE: 180 mg/dL — AB (ref 70–99)
POTASSIUM: 6.3 meq/L — AB (ref 3.7–5.3)
Sodium: 131 mEq/L — ABNORMAL LOW (ref 137–147)
Total Protein: 8.1 g/dL (ref 6.0–8.3)

## 2013-07-04 LAB — CBC WITH DIFFERENTIAL/PLATELET
Basophils Absolute: 0 10*3/uL (ref 0.0–0.1)
Basophils Relative: 0 % (ref 0–1)
Eosinophils Absolute: 0 10*3/uL (ref 0.0–0.7)
Eosinophils Relative: 0 % (ref 0–5)
HCT: 44.9 % (ref 39.0–52.0)
HEMOGLOBIN: 16.3 g/dL (ref 13.0–17.0)
LYMPHS ABS: 0.9 10*3/uL (ref 0.7–4.0)
Lymphocytes Relative: 6 % — ABNORMAL LOW (ref 12–46)
MCH: 29.9 pg (ref 26.0–34.0)
MCHC: 36.3 g/dL — ABNORMAL HIGH (ref 30.0–36.0)
MCV: 82.2 fL (ref 78.0–100.0)
MONOS PCT: 8 % (ref 3–12)
Monocytes Absolute: 1.2 10*3/uL — ABNORMAL HIGH (ref 0.1–1.0)
NEUTROS ABS: 12.4 10*3/uL — AB (ref 1.7–7.7)
NEUTROS PCT: 86 % — AB (ref 43–77)
Platelets: 283 10*3/uL (ref 150–400)
RBC: 5.46 MIL/uL (ref 4.22–5.81)
RDW: 14.7 % (ref 11.5–15.5)
WBC: 14.5 10*3/uL — ABNORMAL HIGH (ref 4.0–10.5)

## 2013-07-04 LAB — LIPASE, BLOOD: Lipase: 13 U/L (ref 11–59)

## 2013-07-04 LAB — URINE MICROSCOPIC-ADD ON

## 2013-07-04 LAB — TROPONIN I: Troponin I: <0.3 ng/mL (ref <0.30)

## 2013-07-04 MED ORDER — SODIUM CHLORIDE 0.9 % IJ SOLN
3.0000 mL | Freq: Two times a day (BID) | INTRAMUSCULAR | Status: DC
  Administered 2013-07-04 – 2013-07-07 (×4): 3 mL via INTRAVENOUS

## 2013-07-04 MED ORDER — SODIUM CHLORIDE 0.9 % IV SOLN
1000.0000 mL | INTRAVENOUS | Status: DC
  Administered 2013-07-04: 1000 mL via INTRAVENOUS

## 2013-07-04 MED ORDER — TAMSULOSIN HCL 0.4 MG PO CAPS
0.4000 mg | ORAL_CAPSULE | Freq: Every day | ORAL | Status: DC
  Administered 2013-07-05 – 2013-07-06 (×3): 0.4 mg via ORAL
  Filled 2013-07-04 (×5): qty 1

## 2013-07-04 MED ORDER — ONDANSETRON HCL 4 MG/2ML IJ SOLN: 4.0000 mg | Freq: Four times a day (QID) | INTRAMUSCULAR | Status: DC | PRN

## 2013-07-04 MED ORDER — ACETAMINOPHEN 650 MG RE SUPP
650.0000 mg | Freq: Four times a day (QID) | RECTAL | Status: DC | PRN
Start: 2013-07-04 — End: 2013-07-07

## 2013-07-04 MED ORDER — ASPIRIN EC 81 MG PO TBEC
81.0000 mg | DELAYED_RELEASE_TABLET | Freq: Every day | ORAL | Status: DC
  Administered 2013-07-05 – 2013-07-07 (×3): 81 mg via ORAL
  Filled 2013-07-04 (×3): qty 1

## 2013-07-04 MED ORDER — SODIUM CHLORIDE 0.9 % IV SOLN
1000.0000 mL | Freq: Once | INTRAVENOUS | Status: AC
  Administered 2013-07-04: 1000 mL via INTRAVENOUS

## 2013-07-04 MED ORDER — ONDANSETRON HCL 4 MG PO TABS
4.0000 mg | ORAL_TABLET | Freq: Four times a day (QID) | ORAL | Status: DC | PRN
Start: 2013-07-04 — End: 2013-07-07

## 2013-07-04 MED ORDER — SODIUM POLYSTYRENE SULFONATE 15 GM/60ML PO SUSP
15.0000 g | Freq: Once | ORAL | Status: AC
  Administered 2013-07-04: 15 g via ORAL
  Filled 2013-07-04: qty 60

## 2013-07-04 MED ORDER — HEPARIN SODIUM (PORCINE) 5000 UNIT/ML IJ SOLN
5000.0000 [IU] | Freq: Three times a day (TID) | INTRAMUSCULAR | Status: DC
  Administered 2013-07-05 – 2013-07-07 (×8): 5000 [IU] via SUBCUTANEOUS
  Filled 2013-07-04 (×12): qty 1

## 2013-07-04 MED ORDER — SODIUM CHLORIDE 0.9 % IV SOLN
1.0000 g | Freq: Once | INTRAVENOUS | Status: AC
  Administered 2013-07-04: 1 g via INTRAVENOUS
  Filled 2013-07-04 (×2): qty 10

## 2013-07-04 MED ORDER — SODIUM CHLORIDE 0.9 % IV SOLN
INTRAVENOUS | Status: DC
  Administered 2013-07-06: 05:00:00 via INTRAVENOUS

## 2013-07-04 MED ORDER — ACETAMINOPHEN 325 MG PO TABS: 650.0000 mg | ORAL_TABLET | Freq: Four times a day (QID) | ORAL | Status: DC | PRN

## 2013-07-04 MED ORDER — ONDANSETRON HCL 4 MG/2ML IJ SOLN
4.0000 mg | Freq: Once | INTRAMUSCULAR | Status: AC
  Administered 2013-07-04: 4 mg via INTRAVENOUS
  Filled 2013-07-04: qty 2

## 2013-07-04 MED ORDER — INSULIN ASPART 100 UNIT/ML ~~LOC~~ SOLN: 10.0000 [IU] | Freq: Once | SUBCUTANEOUS | Status: AC

## 2013-07-04 MED ORDER — INSULIN ASPART 100 UNIT/ML ~~LOC~~ SOLN
10.0000 [IU] | Freq: Once | SUBCUTANEOUS | Status: AC
  Administered 2013-07-04: 10 [IU] via SUBCUTANEOUS
  Filled 2013-07-04: qty 1

## 2013-07-04 MED ORDER — DEXTROSE 50 % IV SOLN
1.0000 | Freq: Once | INTRAVENOUS | Status: AC
  Administered 2013-07-04: 50 mL via INTRAVENOUS
  Filled 2013-07-04: qty 50

## 2013-07-04 NOTE — ED Notes (Signed)
Pt. Cannot void, is aware of needing a urine specimen.

## 2013-07-04 NOTE — H&P (Signed)
[facility] History and Physical  Matthew Abbott ZOX:[id number] DOB: 10-May-1956 DOA: 07/04/2013  Referring physician: Dr. Patria Mane PCP: No PCP Per Patient   Chief Complaint: Diarrhea, nausea and vomiting, and decreased urine output  HPI: Matthew Abbott is a 58 y.o. male  With history of urinary retention hypertension on ACE inhibitor and hydrochlorothiazide. Who presents to the ED complaining of diarrhea nausea and vomiting since Sunday. He also reports decreased urine output. He denies being around any sick contacts and denies any recent antibiotic ingestion. Nothing he is aware of makes the problem better or worse. The problem has been present and persistent since onset this last Sunday (2 days ago). Given worsening of symptoms patient decided to come to the ED for further evaluation.  While in the ED patient had bladder scan which showed an increased amount of urine. Foley catheter was subsequently placed. Patient while in the ED received 2-3 L of normal saline no urinary output until Foley catheter placed. WBC count was elevated at 14.4 but patient was afebrile. Patient's potassium level was elevated at 6.3 of which patient was given calcium gluconate, insulin and 1 amp of D50, and normal saline. We were subsequently consult at for further evaluation recommendations given the above complaints.   Review of Systems:  Constitutional:  No weight loss, night sweats, Fevers, chills, fatigue.  HEENT:  No headaches, Difficulty swallowing,Tooth/dental problems,Sore throat,  No sneezing, itching, ear ache, nasal congestion, post nasal drip,  Cardio-vascular:  No chest pain, Orthopnea, PND, swelling in lower extremities, anasarca, dizziness, palpitations  GI:  No heartburn, indigestion, abdominal pain,+ nausea,+ vomiting,+ diarrhea, change in bowel habits, loss of appetite  Resp:  No shortness of breath with exertion or at rest. No excess mucus, no productive cough, No non-productive cough, No  coughing up of blood.No change in color of mucus.No wheezing.No chest wall deformity  Skin:  no rash or lesions.  GU:  no dysuria, change in color of urine, no urgency or frequency. No flank pain.  Musculoskeletal:  No joint pain or swelling. No decreased range of motion. No back pain.  Psych:  No change in mood or affect. No depression or anxiety. No memory loss.   Past Medical History  Diagnosis Date  . Hypertension   . Stroke    History reviewed. No pertinent past surgical history. Social History:  reports that he has been smoking Cigarettes.  He has a 20.5 pack-year smoking history. He does not have any smokeless tobacco history on file. He reports that he does not drink alcohol or use illicit drugs.  No Known Allergies  Family History  Problem Relation Age of Onset  . Diabetes Mother   . Heart disease Father   . Asthma Sister   . Asthma Brother   . Asthma Sister      Prior to Admission medications   Medication Sig Start Date End Date Taking? Authorizing Provider  aspirin EC 81 MG tablet Take 81 mg by mouth daily.   Yes Historical Provider, MD  lisinopril-hydrochlorothiazide (PRINZIDE,ZESTORETIC) 20-25 MG per tablet Take 1 tablet by mouth daily.   Yes Historical Provider, MD  Tamsulosin HCl (FLOMAX) 0.4 MG CAPS Take 0.4 mg by mouth at bedtime.   Yes Historical Provider, MD   Physical Exam: Filed Vitals:   07/04/13 1913  BP: 149/63  Pulse: 93  Temp:   Resp: 28    BP 149/63  Pulse 93  Temp(Src) 98.3 F (36.8 C) (Oral)  Resp 28  Ht 5\' 11"  (  1.803 m)  Wt 68.04 kg (150 lb)  BMI 20.93 kg/m2  SpO2 94%  General:  Appears calm and comfortable Eyes: PERRL, normal lids, irises & conjunctiva ENT: grossly normal hearing, lips & tongue Neck: no LAD, masses or thyromegaly Cardiovascular: RRR, no m/r/g. No LE edema. Respiratory: CTA bilaterally, no w/r/r. Normal respiratory effort. Abdomen: soft, lower quadrants distended, NT Skin: no rash or induration seen on limited  exam Musculoskeletal: grossly normal tone BUE/BLE Psychiatric: grossly normal mood and affect, speech fluent and appropriate Neurologic: grossly non-focal.          Labs on Admission:  Basic Metabolic Panel:  Recent Labs Lab 07/04/13 1601  NA 131*  K 6.3*  CL 90*  CO2 21  GLUCOSE 180*  BUN 58*  CREATININE 4.36*  CALCIUM 9.0   Liver Function Tests:  Recent Labs Lab 07/04/13 1601  AST 44*  ALT 20  ALKPHOS 124*  BILITOT 0.6  PROT 8.1  ALBUMIN 4.3    Recent Labs Lab 07/04/13 1601  LIPASE 13   No results found for this basename: AMMONIA,  in the last 168 hours CBC:  Recent Labs Lab 07/04/13 1601  WBC 14.5*  NEUTROABS 12.4*  HGB 16.3  HCT 44.9  MCV 82.2  PLT 283   Cardiac Enzymes:  Recent Labs Lab 07/04/13 1602  TROPONINI <0.30    BNP (last 3 results) No results found for this basename: PROBNP,  in the last 8760 hours CBG: No results found for this basename: GLUCAP,  in the last 168 hours  Radiological Exams on Admission: Ct Head Wo Contrast  07/04/2013   CLINICAL DATA:  Dizziness.  EXAM: CT HEAD WITHOUT CONTRAST  TECHNIQUE: Contiguous axial images were obtained from the base of the skull through the vertex without intravenous contrast.  COMPARISON:  MRI brain 03/31/2011.  FINDINGS: The remote infarct of the right cerebellum is again seen. Ex vacuo dilation is noted in the fourth ventricle. No acute cortical infarct, hemorrhage, or mass lesion is present. Ventricles are of normal size. No significant extra-axial fluid collection is present.  The paranasal sinuses and mastoid air cells are clear. The osseous skull is intact.  IMPRESSION: 1. No acute intracranial abnormality. 2. 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   Electronically Signed   By: Gennette Pachris  Mattern M.D.   On: 07/04/2013 16:24    EKG: Independently reviewed. Normal sinus rhythm with no ST elevations or depressions peak T waves  Assessment/Plan Principal problem Acute renal  failure - Will obtain renal ultrasound - Patient bladder scan that had distended bladder, decompressed with Foley placement. - Suspecting cause to be secondary to post renal causes secondary to obstruction as patient has history of urinary retention. However there may also be component of prerenal as patient reports recent nausea and emesis and diarrhea.Was more than likely made worse by ingestion of ACE inhibitor. - Will continue to monitor serum creatinine and continue Foley and tamsulosin.  Hyperkalemia -Patient was given insulin with one ampule of D50 -Calcium gluconate also administered - IV fluids administered as well in the ED. We'll continue normal saline - Administer Kayexalate - Reassess next a.m. - Most likely secondary to principal problem - Repeat EKG next a.m.  Nausea and emesis - None here while in the ED - Suspecting is likely due to principal problem - Supportive therapy and IV fluids  Active Problems:   Hypertension - Will discontinue home regimen given principal problem - Continue to monitor blood pressures    Tobacco abuse -  We'll plan on recommending cessation while patient in house    Urinary retention - Please see above. Continue Foley catheter  Leukocytosis - Will obtain chest x-ray - Most likely secondary to stress from primary problem  Diarrhea - Most likely contributed to - No recent reports of antibiotics - Obtain stool culture   Code Status: full Family Communication: Discussed directly with patient no family at bedside Disposition Plan: Pending further work up  Time spent: > 35 minutes  Penny Pia [facility] Pager [phone]

## 2013-07-04 NOTE — ED Notes (Signed)
Per EMS- pt reporting n/v x 3 days. His daughter reports he developed slurred speech last night, pt reports he developed slurred speech at 1530 and trouble with his gait. Pt has hx of CVA 3 years with similar presentation. Pt did have trouble standing on his left leg. Denies dizziness or pain. Had 1 episode of vomiting with ems. Given 4 mg zofran. BP 128/72, HR 75 SR, CBG 174. Pt is a x 4

## 2013-07-04 NOTE — ED Notes (Signed)
Daugher's contact info:  Phone [phone]  Clydene LamingMorgan Wiest.

## 2013-07-04 NOTE — ED Notes (Signed)
Dr. Patria Maneampos at the bedside updated family and pt. On pt.s status.

## 2013-07-04 NOTE — ED Notes (Signed)
Patient transported to CT 

## 2013-07-04 NOTE — ED Provider Notes (Addendum)
CSN: [id number]     Arrival date & time 07/04/13  1556 History   First MD Initiated Contact with Patient 07/04/13 1559     Chief Complaint  Patient presents with  . Nausea  . Emesis    Patient is a 58 y.o. male presenting with vomiting.  Emesis  Patient's been having nausea and vomiting for 3 days.  Family reports they're concerned that his speech became slightly slurred last night.  Patient reports no slurred speech or difficulty walking last night.  He has a history of CVA 3 years ago.  EMS report that he developed slurred speech again around 2:30 PM.  May also reports some gait instability when he was attempting to get to the EMS stretcher.  Patient denies weakness in his arms or legs this time.  He has no slurred speech at this time.   Past Medical History  Diagnosis Date  . Hypertension   . Stroke    History reviewed. No pertinent past surgical history. Family History  Problem Relation Age of Onset  . Diabetes Mother   . Heart disease Father   . Asthma Sister   . Asthma Brother   . Asthma Sister    History  Substance Use Topics  . Smoking status: Current Every Day Smoker -- 0.50 packs/day for 41 years    Types: Cigarettes  . Smokeless tobacco: Not on file     Comment: cutting back since hospitalization  . Alcohol Use: No    Review of Systems  Gastrointestinal: Positive for vomiting.  All other systems reviewed and are negative.    Allergies  Review of patient's allergies indicates no known allergies.  Home Medications   Current Outpatient Rx  Name  Route  Sig  Dispense  Refill  . aspirin EC 81 MG tablet   Oral   Take 81 mg by mouth daily.         Marland Kitchen lisinopril-hydrochlorothiazide (PRINZIDE,ZESTORETIC) 20-25 MG per tablet   Oral   Take 1 tablet by mouth daily.         . Tamsulosin HCl (FLOMAX) 0.4 MG CAPS   Oral   Take 0.4 mg by mouth at bedtime.          BP 136/71  Pulse 66  Temp(Src) 98.3 F (36.8 C) (Oral)  Resp 34  Ht 5\' 11"  (1.803 m)   Wt 150 lb (68.04 kg)  BMI 20.93 kg/m2  SpO2 99% Physical Exam  Nursing note and vitals reviewed. Constitutional: He is oriented to person, place, and time. He appears well-developed and well-nourished.  HENT:  Head: Normocephalic and atraumatic.  Eyes: EOM are normal. Pupils are equal, round, and reactive to light.  Neck: Normal range of motion.  Cardiovascular: Normal rate, regular rhythm, normal heart sounds and intact distal pulses.   Pulmonary/Chest: Effort normal and breath sounds normal. No respiratory distress.  Abdominal: Soft. He exhibits no distension. There is no tenderness.  Musculoskeletal: Normal range of motion.  Neurological: He is alert and oriented to person, place, and time.  5/5 strength in major muscle groups of  bilateral upper and lower extremities. Speech normal. No facial asymetry.  No pronator drift.  Normal heel-to-shin bilaterally.    Skin: Skin is warm and dry.  Psychiatric: He has a normal mood and affect. Judgment normal.    ED Course  Procedures (including critical care time)  CRITICAL CARE Performed by: Lyanne Co Total critical care time: 30 Critical care time was exclusive of separately billable procedures and  treating other patients. Critical care was necessary to treat or prevent imminent or life-threatening deterioration. Critical care was time spent personally by me on the following activities: development of treatment plan with patient and/or surrogate as well as nursing, discussions with consultants, evaluation of patient's response to treatment, examination of patient, obtaining history from patient or surrogate, ordering and performing treatments and interventions, ordering and review of laboratory studies, ordering and review of radiographic studies, pulse oximetry and re-evaluation of patient's condition.   Labs Review Labs Reviewed  CBC WITH DIFFERENTIAL - Abnormal; Notable for the following:    WBC 14.5 (*)    MCHC 36.3 (*)     Neutrophils Relative % 86 (*)    Neutro Abs 12.4 (*)    Lymphocytes Relative 6 (*)    Monocytes Absolute 1.2 (*)    All other components within normal limits  COMPREHENSIVE METABOLIC PANEL - Abnormal; Notable for the following:    Sodium 131 (*)    Potassium 6.3 (*)    Chloride 90 (*)    Glucose, Bld 180 (*)    BUN 58 (*)    Creatinine, Ser 4.36 (*)    AST 44 (*)    Alkaline Phosphatase 124 (*)    GFR calc non Af Amer 14 (*)    GFR calc Af Amer 16 (*)    All other components within normal limits  LIPASE, BLOOD  TROPONIN I  URINALYSIS, ROUTINE W REFLEX MICROSCOPIC   Imaging Review Ct Head Wo Contrast  07/04/2013   CLINICAL DATA:  Dizziness.  EXAM: CT HEAD WITHOUT CONTRAST  TECHNIQUE: Contiguous axial images were obtained from the base of the skull through the vertex without intravenous contrast.  COMPARISON:  MRI brain 03/31/2011.  FINDINGS: The remote infarct of the right cerebellum is again seen. Ex vacuo dilation is noted in the fourth ventricle. No acute cortical infarct, hemorrhage, or mass lesion is present. Ventricles are of normal size. No significant extra-axial fluid collection is present.  The paranasal sinuses and mastoid air cells are clear. The osseous skull is intact.  IMPRESSION: 1. No acute intracranial abnormality. 2. 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   Electronically Signed   By: Gennette Pachris  Mattern M.D.   On: 07/04/2013 16:24  I personally reviewed the imaging tests through PACS system I reviewed available ER/hospitalization records through the EMR   ECG interpretation  Date: 07/04/2013  Rate: 64  Rhythm: normal sinus rhythm  QRS Axis: normal  Intervals: normal  ST/T Wave abnormalities: Peak T waves with nonspecific ST changes.  No reciprocal changes  Conduction Disutrbances: none  Narrative Interpretation:   Old EKG Reviewed: Peak T waves as compared to prior EKG      MDM   1. Acute renal failure   2. Hyperkalemia   3. Abnormal ECG   4.  Dehydration   5. Nausea vomiting and diarrhea    6:58 PM Patient with evidence of acute renal failure with hyperkalemia and peak T waves.  Patient be given calcium, insulin, D50.  Likely all secondary to dehydration from his nausea vomiting.  Abdominal exam benign.  CT head normal.  Plan to admit to the hospital for IV hydration.  Suspect viral process.  Patient noted feeling somewhat better at this time after his initial 1 L.  2 more liters will be given in bolus form.  Patient doesn't believe he's urinated since yesterday.  Not acidotic based on bicarbonate.   7:53 PM Urinary retention noted. Will place foley  Lyanne Co, MD 07/04/13 1900  Lyanne Co, MD 07/04/13 [phone]

## 2013-07-04 NOTE — ED Notes (Signed)
Clarified with ER Physician, patient is to receive only 10 units of insulin in regards to potassium level, route was changed from SQ to IV.

## 2013-07-05 ENCOUNTER — Inpatient Hospital Stay (HOSPITAL_COMMUNITY): Payer: Medicaid Other

## 2013-07-05 DIAGNOSIS — J189 Pneumonia, unspecified organism: Secondary | ICD-10-CM

## 2013-07-05 DIAGNOSIS — N179 Acute kidney failure, unspecified: Principal | ICD-10-CM

## 2013-07-05 DIAGNOSIS — I10 Essential (primary) hypertension: Secondary | ICD-10-CM

## 2013-07-05 LAB — BASIC METABOLIC PANEL
BUN: 50 mg/dL — AB (ref 6–23)
CHLORIDE: 100 meq/L (ref 96–112)
CO2: 19 mEq/L (ref 19–32)
Calcium: 8.3 mg/dL — ABNORMAL LOW (ref 8.4–10.5)
Creatinine, Ser: 2.52 mg/dL — ABNORMAL HIGH (ref 0.50–1.35)
GFR calc Af Amer: 30 mL/min — ABNORMAL LOW (ref >90)
GFR calc non Af Amer: 26 mL/min — ABNORMAL LOW (ref >90)
Glucose, Bld: 122 mg/dL — ABNORMAL HIGH (ref 70–99)
POTASSIUM: 3.9 meq/L (ref 3.7–5.3)
Sodium: 137 mEq/L (ref 137–147)

## 2013-07-05 LAB — CBC
HEMATOCRIT: 40.5 % (ref 39.0–52.0)
HEMOGLOBIN: 14.7 g/dL (ref 13.0–17.0)
MCH: 29.7 pg (ref 26.0–34.0)
MCHC: 36.3 g/dL — ABNORMAL HIGH (ref 30.0–36.0)
MCV: 81.8 fL (ref 78.0–100.0)
Platelets: 223 10*3/uL (ref 150–400)
RBC: 4.95 MIL/uL (ref 4.22–5.81)
RDW: 14.5 % (ref 11.5–15.5)
WBC: 12.4 10*3/uL — ABNORMAL HIGH (ref 4.0–10.5)

## 2013-07-05 MED ORDER — ENSURE COMPLETE PO LIQD
237.0000 mL | ORAL | Status: DC
  Administered 2013-07-05: 237 mL via ORAL

## 2013-07-05 MED ORDER — BOOST / RESOURCE BREEZE PO LIQD
1.0000 | ORAL | Status: DC
  Administered 2013-07-05 – 2013-07-06 (×2): 1 via ORAL

## 2013-07-05 MED ORDER — DEXTROSE 5 % IV SOLN
1.0000 g | INTRAVENOUS | Status: DC
  Administered 2013-07-05 – 2013-07-06 (×2): 1 g via INTRAVENOUS
  Filled 2013-07-05 (×3): qty 10

## 2013-07-05 MED ORDER — DEXTROSE 5 % IV SOLN
500.0000 mg | INTRAVENOUS | Status: DC
  Administered 2013-07-05: 500 mg via INTRAVENOUS
  Filled 2013-07-05 (×3): qty 500

## 2013-07-05 NOTE — Progress Notes (Signed)
[facility] PROGRESS NOTE  Matthew Abbott WUJ:[id number] DOB: June 13, 1955 DOA: 07/04/2013 PCP: No PCP Per Patient  Assessment/Plan: 58 y.o. male with history of urinary retention hypertension on ACE inhibitor and hydrochlorothiazide, BPH, presents to the ED complaining of diarrhea nausea and vomiting, decreased urine output found to have AKI, CAP  1. AKI, hyperK likely combination prerenal +ACE+post obstructive;  - Patient bladder scan that had distended bladder, decompressed with Foley placement. Renal US: No  prominent hydronephrosis -cont IVF, Cr improving; hold ACE  2. CAP; CXR: L pneumonia;  -start IV atx, bronchodilators, oxygen, blood cultures; need outpatient CXR to f/u   3. Hyperkalemia due to AKI, ACE; resolved'   4. Hypertension  Hold BP meds; continue to monitor blood pressures   5. Urinary retention, BPH Please see above. Continue Foley catheter; need voiding tr    Code Status: full Family Communication: d/w patient (indicate person spoken with, relationship, and if by phone, the number) Disposition Plan: home 24-48 hours    Consultants:  None   Procedures:  None   Antibiotics:  Ceftriaxone 2/4<<<  azythro 2/4<<<   (indicate start date, and stop date if known)  HPI/Subjective: alert  Objective: Filed Vitals:   07/05/13 0500  BP: 112/59  Pulse: 79  Temp: 98.9 F (37.2 C)  Resp: 19    Intake/Output Summary (Last 24 hours) at 07/05/13 1223 Last data filed at 07/05/13 0500  Gross per 24 hour  Intake    650 ml  Output   1160 ml  Net   -510 ml   Filed Weights   07/04/13 1604 07/04/13 2135  Weight: 68.04 kg (150 lb) 66.5 kg (146 lb 9.7 oz)    Exam:   General:  alert  Cardiovascular: s1,s2 rrr  Respiratory: L lung few rales  Abdomen: soft, nt, nd   Musculoskeletal: no edema   Data Reviewed: Basic Metabolic Panel:  Recent Labs Lab 07/04/13 1601 07/05/13 0500  NA 131* 137  K 6.3* 3.9  CL 90* 100  CO2 21 19  GLUCOSE  180* 122*  BUN 58* 50*  CREATININE 4.36* 2.52*  CALCIUM 9.0 8.3*   Liver Function Tests:  Recent Labs Lab 07/04/13 1601  AST 44*  ALT 20  ALKPHOS 124*  BILITOT 0.6  PROT 8.1  ALBUMIN 4.3    Recent Labs Lab 07/04/13 1601  LIPASE 13   No results found for this basename: AMMONIA,  in the last 168 hours CBC:  Recent Labs Lab 07/04/13 1601 07/05/13 0500  WBC 14.5* 12.4*  NEUTROABS 12.4*  --   HGB 16.3 14.7  HCT 44.9 40.5  MCV 82.2 81.8  PLT 283 223   Cardiac Enzymes:  Recent Labs Lab 07/04/13 1602  TROPONINI <0.30   BNP (last 3 results) No results found for this basename: PROBNP,  in the last 8760 hours CBG: No results found for this basename: GLUCAP,  in the last 168 hours  Recent Results (from the past 240 hour(s))  STOOL CULTURE     Status: None   Collection Time    07/05/13  4:17 AM      Result Value Range Status   Specimen Description STOOL   Final   Special Requests NONE   Final   Culture     Final   Value: Culture reincubated for better growth     Performed at [facility]   Report Status PENDING   Incomplete     Studies: Dg Chest 2 View  07/04/2013  CLINICAL DATA:  Vomiting for 3 days.  EXAM: CHEST  2 VIEW  COMPARISON:  None.  FINDINGS: There is extensive left lower lobe airspace disease most consistent with pneumonia. The right lung is clear. Heart size is normal. No pneumothorax or pleural fluid.  IMPRESSION: Extensive left lower lobe airspace disease most consistent with pneumonia. Recommend followup to clearing.   Electronically Signed   By: Drusilla Kannerhomas  Dalessio M.D.   On: 07/04/2013 23:21   Ct Head Wo Contrast  07/04/2013   CLINICAL DATA:  Dizziness.  EXAM: CT HEAD WITHOUT CONTRAST  TECHNIQUE: Contiguous axial images were obtained from the base of the skull through the vertex without intravenous contrast.  COMPARISON:  MRI brain 03/31/2011.  FINDINGS: The remote infarct of the right cerebellum is again seen. Ex vacuo dilation is noted in  the fourth ventricle. No acute cortical infarct, hemorrhage, or mass lesion is present. Ventricles are of normal size. No significant extra-axial fluid collection is present.  The paranasal sinuses and mastoid air cells are clear. The osseous skull is intact.  IMPRESSION: 1. No acute intracranial abnormality. 2. 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   Electronically Signed   By: Gennette Pachris  Mattern M.D.   On: 07/04/2013 16:24   [place] Renal  07/05/2013   CLINICAL DATA:  Renal failure.  EXAM: RENAL/URINARY TRACT ULTRASOUND COMPLETE  COMPARISON:  None.  FINDINGS: Right Kidney:  Length: 12.3 cm. Echogenicity normal. 1.0 x 1.0 x 1.1 cm simple cyst lower pole right kidney. Minimal pelvocaliectasis on the right cannot be excluded.  Left Kidney:  Length: 12.6 cm. Echogenicity normal. 1.2 x 1.0 x 0.9 cm cyst left upper pole no hydronephrosis.  Bladder:  Bladder nondistended.  Foley catheter in bladder.  IMPRESSION: 1. Cannot exclude very mild pelvocaliectasis right kidney. No prominent hydronephrosis . 2. Simple cyst both kidneys. 3. Foley catheter in bladder.  Bladder nondistended.   Electronically Signed   By: Maisie Fushomas  Register   On: 07/05/2013 10:11    Scheduled Meds: . aspirin EC  81 mg Oral Daily  . feeding supplement (ENSURE COMPLETE)  237 mL Oral Q24H  . feeding supplement (RESOURCE BREEZE)  1 Container Oral Q24H  . heparin  5,000 Units Subcutaneous Q8H  . sodium chloride  3 mL Intravenous Q12H  . tamsulosin  0.4 mg Oral QHS   Continuous Infusions: . sodium chloride Stopped (07/04/13 1927)  . sodium chloride 100 mL/hr at 07/04/13 2230    Active Problems:   Hypertension   Tobacco abuse   Urinary retention   Acute renal failure   Hyperkalemia    Time spent:>35 minutes     [name], [name]  [facility] Pager [phone]. If 7PM-7AM, please contact night-coverage at [email or web address], password [facility] 07/05/2013, 12:23 PM  LOS: 1 day

## 2013-07-05 NOTE — Evaluation (Addendum)
Physical Therapy Evaluation and Discharge Patient Details Name: Matthew Abbott MRN: [id number] DOB: 1956-01-07 Today's Date: 07/05/2013 Time: 5284-1324 PT Time Calculation (min): 23 min  PT Assessment / Plan / Recommendation History of Present Illness  Diarrhea, nausea and vomiting, and decreased urine output PMHx Rt cerebellar CVA x 57yrs ago;   Clinical Impression  Patient evaluated by Physical Therapy with no further acute PT needs identified. All education has been completed and the patient has no further questions.  PT is signing off. Thank you for this referral.     PT Assessment  Patent does not need any further PT services    Follow Up Recommendations  No PT follow up    Does the patient have the potential to tolerate intense rehabilitation      Barriers to Discharge        Equipment Recommendations  None recommended by PT    Recommendations for Other Services     Frequency      Precautions / Restrictions Precautions Precautions: None   Pertinent Vitals/Pain Denied pain      Mobility  Bed Mobility Overal bed mobility: Modified Independent Transfers Overall transfer level: Needs assistance Equipment used: Rolling walker (2 wheeled);None Transfers: Sit to/from Stand Sit to Stand: Supervision;Modified independent (Device/Increase time) General transfer comment: mostly required cues for safe use of RW; however progressed to no RW as he did well with mobility Ambulation/Gait Ambulation/Gait assistance: Supervision Ambulation Distance (Feet): 40 Feet Assistive device: Rolling walker (2 wheeled);None Gait Pattern/deviations: Step-through pattern General Gait Details: limited to in the room ambulation due to active diarrhea and on enteric precautions; assisted pt to the bathroom during session (pt with urgency, but remained continent)    Exercises     PT Diagnosis:    PT Problem List:   PT Treatment Interventions:       PT Goals(Current goals can be found  in the care plan section) Acute Rehab PT Goals PT Goal Formulation: No goals set, d/c therapy  Visit Information  Last PT Received On: 07/05/13 Assistance Needed: +1 History of Present Illness: Diarrhea, nausea and vomiting, and decreased urine output PMHx Rt cerebellar CVA x 84yrs ago;        Prior Functioning  Home Living Family/patient expects to be discharged to:: Private residence Living Arrangements: Alone Available Help at Discharge: Friend(s);Available PRN/intermittently Prior Function Level of Independence: Independent Comments: reports his balance had gone back to baseline after cerebellar CVA; drives, grocery shops Communication Communication: No difficulties    Cognition  Cognition Arousal/Alertness: Awake/alert Behavior During Therapy: WFL for tasks assessed/performed Overall Cognitive Status: Within Functional Limits for tasks assessed    Extremity/Trunk Assessment Upper Extremity Assessment Upper Extremity Assessment: Overall WFL for tasks assessed Lower Extremity Assessment Lower Extremity Assessment: Overall WFL for tasks assessed Cervical / Trunk Assessment Cervical / Trunk Assessment: Normal   Balance Balance Overall balance assessment: Independent Standardized Balance Assessment Standardized Balance Assessment : Berg Balance Test Berg Balance Test Sit to Stand: Able to stand without using hands and stabilize independently Standing Unsupported: Able to stand safely 2 minutes Sitting with Back Unsupported but Feet Supported on Floor or Stool: Able to sit safely and securely 2 minutes Stand to Sit: Sits safely with minimal use of hands Transfers: Able to transfer safely, minor use of hands Standing Ubsupported with Feet Together: Able to place feet together independently and stand 1 minute safely From Standing, Reach Forward with Outstretched Arm: Can reach confidently >25 cm (10") Turn 360 Degrees: Able to turn  360 degrees safely in 4 seconds or less   End of Session PT - End of Session Activity Tolerance: Patient tolerated treatment well Patient left: in chair;with call bell/phone within reach Nurse Communication: Mobility status;Other (comment) (d/c from PT; needs supervision due to lines)  GP     Matthew Abbott 07/05/2013, 11:53 AM Pager [phone]

## 2013-07-05 NOTE — Progress Notes (Signed)
Pharmacy consulted to dose Rocephin for CAP. 58 yo M. Wt 66.5 WBC 14.5>>12.4. Creat 4.39>>2.52.  Tmax 99.2.  2/3 CXR extensive LLL airspace dz most c/w PNA. Rocephin is not renally dose adjusted. Plan: Rocephin 1 gm IV q24 Pharmacy will sign off. Thanks Herby AbrahamMichelle T. Piper [place]sebrock, Pharm.D. [phone] 07/05/2013 1:18 PM

## 2013-07-05 NOTE — Progress Notes (Signed)
INITIAL NUTRITION ASSESSMENT  DOCUMENTATION CODES Per approved criteria  -Not Applicable   INTERVENTION: Provide Ensure Complete once daily Provide Resource Breeze once daily  NUTRITION DIAGNOSIS: Inadequate oral intake related to nausea and vomiting as evidenced by no po intake for 3 days per pt report.   Goal: Pt to meet >/= 90% of their estimated nutrition needs   Monitor:  PO intake Weight Labs  Reason for Assessment: Malnutrition Screening Tool, score of 3  58 y.o. male  Admitting Dx: <principal problem not specified>  ASSESSMENT: 58 y.o. male with history of urinary retention hypertension on ACE inhibitor and hydrochlorothiazide. Who presents to the ED complaining of diarrhea nausea and vomiting since Sunday. He also reports decreased urine output.  Pt states that he has no been able to eat anything since Sunday due to nausea and vomiting. He is not sure how much he usually weight but, he suspects that he has lost 2 to 3 lbs in the past few days. Pt about to eat a late breakfast at time of visit. Pt states he has no appetite but, he is going to try to eat. Pt agreeable to trying Ensure and Boost supplements if PO intake remains inadequate.   Nutrition Focused Physical Exam:  Subcutaneous Fat:  Orbital Region: wnl Upper Arm Region: wnl Thoracic and Lumbar Region: NA  Muscle:  Temple Region: wnl Clavicle Bone Region: mild wasting Clavicle and Acromion Bone Region: mild wasting Scapular Bone Region: NA Dorsal Hand: wnl Patellar Region: mild wasting Anterior Thigh Region: mild wasting Posterior Calf Region: mild wasting  Edema: none    Height: Ht Readings from Last 1 Encounters:  07/04/13 5\' 11"  (1.803 m)    Weight: Wt Readings from Last 1 Encounters:  07/04/13 146 lb 9.7 oz (66.5 kg)    Ideal Body Weight: 172 lbs  % Ideal Body Weight: 85%  Wt Readings from Last 10 Encounters:  07/04/13 146 lb 9.7 oz (66.5 kg)  07/30/11 150 lb (68.04 kg)   04/07/11 141 lb 8.6 oz (64.2 kg)  04/01/11 147 lb 0.8 oz (66.7 kg)    Usual Body Weight: unknown  % Usual Body Weight: NA  BMI:  Body mass index is 20.46 kg/(m^2).  Estimated Nutritional Needs: Kcal: [id number] Protein: 75- 85 grams Fluid: 1.7-1.9 L/day  Skin: WDL  Diet Order: General  EDUCATION NEEDS: -No education needs identified at this time   Intake/Output Summary (Last 24 hours) at 07/05/13 0949 Last data filed at 07/05/13 0500  Gross per 24 hour  Intake    650 ml  Output   1160 ml  Net   -510 ml    Last BM: 2/3  Labs:   Recent Labs Lab 07/04/13 1601 07/05/13 0500  NA 131* 137  K 6.3* 3.9  CL 90* 100  CO2 21 19  BUN 58* 50*  CREATININE 4.36* 2.52*  CALCIUM 9.0 8.3*  GLUCOSE 180* 122*    CBG (last 3)  No results found for this basename: GLUCAP,  in the last 72 hours  Scheduled Meds: . aspirin EC  81 mg Oral Daily  . heparin  5,000 Units Subcutaneous Q8H  . sodium chloride  3 mL Intravenous Q12H  . tamsulosin  0.4 mg Oral QHS    Continuous Infusions: . sodium chloride Stopped (07/04/13 1927)  . sodium chloride 100 mL/hr at 07/04/13 2230    Past Medical History  Diagnosis Date  . Hypertension   . Stroke     History reviewed. No pertinent past surgical  history.  Ian Malkin RD, LDN Inpatient Clinical Dietitian Pager: [phone] After Hours Pager: [phone]

## 2013-07-06 DIAGNOSIS — F17200 Nicotine dependence, unspecified, uncomplicated: Secondary | ICD-10-CM

## 2013-07-06 DIAGNOSIS — R339 Retention of urine, unspecified: Secondary | ICD-10-CM

## 2013-07-06 DIAGNOSIS — E860 Dehydration: Secondary | ICD-10-CM

## 2013-07-06 LAB — BASIC METABOLIC PANEL
BUN: 27 mg/dL — AB (ref 6–23)
CHLORIDE: 102 meq/L (ref 96–112)
CO2: 19 meq/L (ref 19–32)
Calcium: 8.1 mg/dL — ABNORMAL LOW (ref 8.4–10.5)
Creatinine, Ser: 1.19 mg/dL (ref 0.50–1.35)
GFR calc Af Amer: 76 mL/min — ABNORMAL LOW (ref >90)
GFR calc non Af Amer: 65 mL/min — ABNORMAL LOW (ref >90)
GLUCOSE: 131 mg/dL — AB (ref 70–99)
POTASSIUM: 3.7 meq/L (ref 3.7–5.3)
Sodium: 137 mEq/L (ref 137–147)

## 2013-07-06 MED ORDER — AZITHROMYCIN 500 MG PO TABS
500.0000 mg | ORAL_TABLET | Freq: Every day | ORAL | Status: DC
  Administered 2013-07-06 – 2013-07-07 (×2): 500 mg via ORAL
  Filled 2013-07-06 (×2): qty 1

## 2013-07-06 NOTE — Progress Notes (Signed)
[facility] PROGRESS NOTE  Matthew Abbott AVW:[id number] DOB: [birth year] DOA: 07/04/2013 PCP: No PCP Per Patient  Assessment/Plan: 58 y.o. male with history of urinary retention hypertension on ACE inhibitor and hydrochlorothiazide, BPH, presents to the ED complaining of diarrhea nausea and vomiting, decreased urine output found to have AKI, CAP  1. AKI, hyperK likely combination prerenal +ACE+post obstructive;  - Patient bladder scan that had distended bladder, decompressed with Foley placement. Renal US: No  prominent hydronephrosis -improved on IVF, Cr improving; hold ACE  2. CAP; CXR: L pneumonia;  -started IV atx, bronchodilators, oxygen, blood cultures; need outpatient CXR to f/u   3. Hyperkalemia due to AKI, ACE; resolved'   4. Hypertension  Hold BP meds; continue to monitor blood pressures   5. Urinary retention, BPH Please see above. D/c Foley catheter;  voiding tr    Code Status: full Family Communication: d/w patient (indicate person spoken with, relationship, and if by phone, the number) Disposition Plan: home 24-48 hours    Consultants:  None   Procedures:  None   Antibiotics:  Ceftriaxone 2/4<<<  azythro 2/4<<<   (indicate start date, and stop date if known)  HPI/Subjective: alert  Objective: Filed Vitals:   07/06/13 0611  BP: 109/65  Pulse: 70  Temp: 97.9 F (36.6 C)  Resp: 16    Intake/Output Summary (Last 24 hours) at 07/06/13 0829 Last data filed at 07/06/13 0507  Gross per 24 hour  Intake   1500 ml  Output   3070 ml  Net  -1570 ml   Filed Weights   07/04/13 1604 07/04/13 2135  Weight: 68.04 kg (150 lb) 66.5 kg (146 lb 9.7 oz)    Exam:   General:  alert  Cardiovascular: s1,s2 rrr  Respiratory: L lung few rales  Abdomen: soft, nt, nd   Musculoskeletal: no edema   Data Reviewed: Basic Metabolic Panel:  Recent Labs Lab 07/04/13 1601 07/05/13 0500 07/06/13 0539  NA 131* 137 137  K 6.3* 3.9 3.7  CL 90* 100  102  CO2 21 19 19   GLUCOSE 180* 122* 131*  BUN 58* 50* 27*  CREATININE 4.36* 2.52* 1.19  CALCIUM 9.0 8.3* 8.1*   Liver Function Tests:  Recent Labs Lab 07/04/13 1601  AST 44*  ALT 20  ALKPHOS 124*  BILITOT 0.6  PROT 8.1  ALBUMIN 4.3    Recent Labs Lab 07/04/13 1601  LIPASE 13   No results found for this basename: AMMONIA,  in the last 168 hours CBC:  Recent Labs Lab 07/04/13 1601 07/05/13 0500  WBC 14.5* 12.4*  NEUTROABS 12.4*  --   HGB 16.3 14.7  HCT 44.9 40.5  MCV 82.2 81.8  PLT 283 223   Cardiac Enzymes:  Recent Labs Lab 07/04/13 1602  TROPONINI <0.30   BNP (last 3 results) No results found for this basename: PROBNP,  in the last 8760 hours CBG: No results found for this basename: GLUCAP,  in the last 168 hours  Recent Results (from the past 240 hour(s))  STOOL CULTURE     Status: None   Collection Time    07/05/13  4:17 AM      Result Value Range Status   Specimen Description STOOL   Final   Special Requests NONE   Final   Culture     Final   Value: NO SUSPICIOUS COLONIES, CONTINUING TO HOLD     Performed at [facility]   Report Status PENDING   Incomplete  Studies: Dg Chest 2 View  07/04/2013   CLINICAL DATA:  Vomiting for 3 days.  EXAM: CHEST  2 VIEW  COMPARISON:  None.  FINDINGS: There is extensive left lower lobe airspace disease most consistent with pneumonia. The right lung is clear. Heart size is normal. No pneumothorax or pleural fluid.  IMPRESSION: Extensive left lower lobe airspace disease most consistent with pneumonia. Recommend followup to clearing.   Electronically Signed   By: Drusilla Kannerhomas  Dalessio M.D.   On: 07/04/2013 23:21   Ct Head Wo Contrast  07/04/2013   CLINICAL DATA:  Dizziness.  EXAM: CT HEAD WITHOUT CONTRAST  TECHNIQUE: Contiguous axial images were obtained from the base of the skull through the vertex without intravenous contrast.  COMPARISON:  MRI brain 03/31/2011.  FINDINGS: The remote infarct of the right  cerebellum is again seen. Ex vacuo dilation is noted in the fourth ventricle. No acute cortical infarct, hemorrhage, or mass lesion is present. Ventricles are of normal size. No significant extra-axial fluid collection is present.  The paranasal sinuses and mastoid air cells are clear. The osseous skull is intact.  IMPRESSION: 1. No acute intracranial abnormality. 2. 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   Electronically Signed   By: Gennette Pachris  Mattern M.D.   On: 07/04/2013 16:24   [place] Renal  07/05/2013   CLINICAL DATA:  Renal failure.  EXAM: RENAL/URINARY TRACT ULTRASOUND COMPLETE  COMPARISON:  None.  FINDINGS: Right Kidney:  Length: 12.3 cm. Echogenicity normal. 1.0 x 1.0 x 1.1 cm simple cyst lower pole right kidney. Minimal pelvocaliectasis on the right cannot be excluded.  Left Kidney:  Length: 12.6 cm. Echogenicity normal. 1.2 x 1.0 x 0.9 cm cyst left upper pole no hydronephrosis.  Bladder:  Bladder nondistended.  Foley catheter in bladder.  IMPRESSION: 1. Cannot exclude very mild pelvocaliectasis right kidney. No prominent hydronephrosis . 2. Simple cyst both kidneys. 3. Foley catheter in bladder.  Bladder nondistended.   Electronically Signed   By: Maisie Fushomas  Register   On: 07/05/2013 10:11    Scheduled Meds: . aspirin EC  81 mg Oral Daily  . azithromycin  500 mg Intravenous Q24H  . cefTRIAXone (ROCEPHIN)  IV  1 g Intravenous Q24H  . feeding supplement (ENSURE COMPLETE)  237 mL Oral Q24H  . feeding supplement (RESOURCE BREEZE)  1 Container Oral Q24H  . heparin  5,000 Units Subcutaneous Q8H  . sodium chloride  3 mL Intravenous Q12H  . tamsulosin  0.4 mg Oral QHS   Continuous Infusions: . sodium chloride 125 mL/hr at 07/06/13 0456    Active Problems:   Hypertension   Tobacco abuse   Urinary retention   Acute renal failure   Hyperkalemia    Time spent:>35 minutes     [name], Kadie Balestrieri N  [facility] Pager [phone]. If 7PM-7AM, please contact night-coverage at [email or web address],  password [facility] 07/06/2013, 8:29 AM  LOS: 2 days

## 2013-07-06 NOTE — Progress Notes (Signed)
PHARMACIST - PHYSICIAN COMMUNICATION DR:   York SpanielBuriev CONCERNING: Antibiotic IV to Oral Route Change Policy  RECOMMENDATION: This patient is receiving azithromycin by the intravenous route.  Based on criteria approved by the Pharmacy and Therapeutics Committee, the antibiotic(s) is/are being converted to the equivalent oral dose form(s).   DESCRIPTION: These criteria include:  Patient being treated for a respiratory tract infection, urinary tract infection, cellulitis or clostridium difficile associated diarrhea if on metronidazole  The patient is not neutropenic and does not exhibit a GI malabsorption state  The patient is eating (either orally or via tube) and/or has been taking other orally administered medications for a least 24 hours  The patient is improving clinically and has a Tmax < 100.5  If you have questions about this conversion, please contact the Pharmacy Department  []   [phone]( [phone] )  [facility] [x]   [phone]( [phone] )  [facility]  []   [phone]( [phone] )  [facility] []   [phone]( [phone] )  [facility]   Celedonio MiyamotoJeremy Martha Ellerby, PharmD, BCPS Clinical Pharmacist Pager [phone]

## 2013-07-07 DIAGNOSIS — E875 Hyperkalemia: Secondary | ICD-10-CM

## 2013-07-07 MED ORDER — TAMSULOSIN HCL 0.4 MG PO CAPS: 0.4000 mg | ORAL_CAPSULE | Freq: Every day | ORAL | Status: DC

## 2013-07-07 MED ORDER — LEVOFLOXACIN 750 MG PO TABS: 750.0000 mg | ORAL_TABLET | Freq: Every day | ORAL | Status: AC

## 2013-07-07 NOTE — Discharge Summary (Signed)
Physician Discharge Summary  Matthew Abbott ZOX:[id number] DOB: 1956-05-16 DOA: 07/04/2013  PCP: No PCP Per Patient  Admit date: 07/04/2013 Discharge date: 07/07/2013  Time spent: >35 minutes  Recommendations for Outpatient Follow-up:  F/u with PCP in 1 week  Discharge Diagnoses:  Active Problems:   Hypertension   Tobacco abuse   Urinary retention   Acute renal failure   Hyperkalemia   Discharge Condition:  Stable   Diet recommendation: heart healthy  Filed Weights   07/04/13 1604 07/04/13 2135 07/06/13 1300  Weight: 68.04 kg (150 lb) 66.5 kg (146 lb 9.7 oz) 68 kg (149 lb 14.6 oz)    History of present illness:  57 y.o. male with history of urinary retention hypertension on ACE inhibitor and hydrochlorothiazide, BPH, presents to the ED complaining of diarrhea nausea and vomiting, decreased urine output found to have AKI, CAP    Hospital Course:  1. AKI, hyperK likely combination prerenal +ACE/diuretics+post obstructive;  - Patient bladder scan that had distended bladder, decompressed with Foley placement. Renal US: No  prominent hydronephrosis  -AKI revolved on IVF, Cr improving; hold ACE/diuretics; f/u with  PCP in 1 week  2. CAP; CXR: L pneumonia;  -improved on IV atx, to complete PO atx and outpatient CXR to f/u  3. Hyperkalemia due to AKI, ACE; resolved'  4. Hypertension Hold BP meds; BP stable off meds; outpatient f/u next week re evaluate for blood pressure medication treatment  5. Urinary retention, BPH.  D/c Foley catheter; passed voiding tr    Procedures:  None  (i.e. Studies not automatically included, echos, thoracentesis, etc; not x-rays)  Consultations:  None   Discharge Exam: Filed Vitals:   07/07/13 0503  BP: 121/55  Pulse: 88  Temp: 100.2 F (37.9 C)  Resp: 20    General: alert Cardiovascular: s1,s2 rrr Respiratory: CTA BL  Discharge Instructions  Discharge Orders   Future Orders Complete By Expires   Diet - low sodium heart healthy   As directed    Discharge instructions  As directed    Comments:     Please follow up with primary care doctor in 1 week   Increase activity slowly  As directed        Medication List    STOP taking these medications       lisinopril-hydrochlorothiazide 20-25 MG per tablet  Commonly known as:  PRINZIDE,ZESTORETIC      TAKE these medications       aspirin EC 81 MG tablet  Take 81 mg by mouth daily.     levofloxacin 750 MG tablet  Commonly known as:  LEVAQUIN  Take 1 tablet (750 mg total) by mouth daily.     tamsulosin 0.4 MG Caps capsule  Commonly known as:  FLOMAX  Take 1 capsule (0.4 mg total) by mouth at bedtime.       No Known Allergies     Follow-up Information   Follow up with [facility]    . Schedule an appointment as soon as possible for a visit in 1 week.   Contact information:   [address] [place] [address] [phone]       The results of significant diagnostics from this hospitalization (including imaging, microbiology, ancillary and laboratory) are listed below for reference.    Significant Diagnostic Studies: Dg Chest 2 View  07/04/2013   CLINICAL DATA:  Vomiting for 3 days.  EXAM: CHEST  2 VIEW  COMPARISON:  None.  FINDINGS: There  is extensive left lower lobe airspace disease most consistent with pneumonia. The right lung is clear. Heart size is normal. No pneumothorax or pleural fluid.  IMPRESSION: Extensive left lower lobe airspace disease most consistent with pneumonia. Recommend followup to clearing.   Electronically Signed   By: Drusilla Kanner M.D.   On: 07/04/2013 23:21   Ct Head Wo Contrast  07/04/2013   CLINICAL DATA:  Dizziness.  EXAM: CT HEAD WITHOUT CONTRAST  TECHNIQUE: Contiguous axial images were obtained from the base of the skull through the vertex without intravenous contrast.  COMPARISON:  MRI brain 03/31/2011.  FINDINGS: The remote infarct of the right cerebellum is again seen. Ex vacuo  dilation is noted in the fourth ventricle. No acute cortical infarct, hemorrhage, or mass lesion is present. Ventricles are of normal size. No significant extra-axial fluid collection is present.  The paranasal sinuses and mastoid air cells are clear. The osseous skull is intact.  IMPRESSION: 1. No acute intracranial abnormality. 2. 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   Electronically Signed   By: Gennette Pac M.D.   On: 07/04/2013 16:24   US Renal  07/05/2013   CLINICAL DATA:  Renal failure.  EXAM: RENAL/URINARY TRACT ULTRASOUND COMPLETE  COMPARISON:  None.  FINDINGS: Right Kidney:  Length: 12.3 cm. Echogenicity normal. 1.0 x 1.0 x 1.1 cm simple cyst lower pole right kidney. Minimal pelvocaliectasis on the right cannot be excluded.  Left Kidney:  Length: 12.6 cm. Echogenicity normal. 1.2 x 1.0 x 0.9 cm cyst left upper pole no hydronephrosis.  Bladder:  Bladder nondistended.  Foley catheter in bladder.  IMPRESSION: 1. Cannot exclude very mild pelvocaliectasis right kidney. No prominent hydronephrosis . 2. Simple cyst both kidneys. 3. Foley catheter in bladder.  Bladder nondistended.   Electronically Signed   By: Maisie Fus  Register   On: 07/05/2013 10:11    Microbiology: Recent Results (from the past 240 hour(s))  STOOL CULTURE     Status: None   Collection Time    07/05/13  4:17 AM      Result Value Range Status   Specimen Description STOOL   Final   Special Requests NONE   Final   Culture     Final   Value: NO SUSPICIOUS COLONIES, CONTINUING TO HOLD     Performed at [facility]   Report Status PENDING   Incomplete     Labs: Basic Metabolic Panel:  Recent Labs Lab 07/04/13 1601 07/05/13 0500 07/06/13 0539  NA 131* 137 137  K 6.3* 3.9 3.7  CL 90* 100 102  CO2 21 19 19   GLUCOSE 180* 122* 131*  BUN 58* 50* 27*  CREATININE 4.36* 2.52* 1.19  CALCIUM 9.0 8.3* 8.1*   Liver Function Tests:  Recent Labs Lab 07/04/13 1601  AST 44*  ALT 20  ALKPHOS 124*  BILITOT  0.6  PROT 8.1  ALBUMIN 4.3    Recent Labs Lab 07/04/13 1601  LIPASE 13   No results found for this basename: AMMONIA,  in the last 168 hours CBC:  Recent Labs Lab 07/04/13 1601 07/05/13 0500  WBC 14.5* 12.4*  NEUTROABS 12.4*  --   HGB 16.3 14.7  HCT 44.9 40.5  MCV 82.2 81.8  PLT 283 223   Cardiac Enzymes:  Recent Labs Lab 07/04/13 1602  TROPONINI <0.30   BNP: BNP (last 3 results) No results found for this basename: PROBNP,  in the last 8760 hours CBG: No results found for this basename: GLUCAP,  in  the last 168 hours     Signed:  Jonette MateBURIEV, Rashawna Scoles N  [facility] 07/07/2013, 8:35 AM

## 2013-07-09 LAB — STOOL CULTURE

## 2014-12-28 ENCOUNTER — Emergency Department (HOSPITAL_COMMUNITY): Payer: Medicare Other

## 2014-12-28 ENCOUNTER — Encounter (HOSPITAL_COMMUNITY): Payer: Self-pay | Admitting: Emergency Medicine

## 2014-12-28 ENCOUNTER — Emergency Department (HOSPITAL_COMMUNITY)
Admission: EM | Admit: 2014-12-28 | Discharge: 2014-12-28 | Disposition: A | Discharge status: Home / Self Care | Payer: Medicare Other | Attending: Emergency Medicine | Admitting: Emergency Medicine

## 2014-12-28 DIAGNOSIS — R1013 Epigastric pain: Secondary | ICD-10-CM | POA: Diagnosis not present

## 2014-12-28 DIAGNOSIS — I10 Essential (primary) hypertension: Secondary | ICD-10-CM | POA: Diagnosis not present

## 2014-12-28 DIAGNOSIS — Z8673 Personal history of transient ischemic attack (TIA), and cerebral infarction without residual deficits: Secondary | ICD-10-CM | POA: Diagnosis not present

## 2014-12-28 DIAGNOSIS — Z7982 Long term (current) use of aspirin: Secondary | ICD-10-CM | POA: Insufficient documentation

## 2014-12-28 DIAGNOSIS — R634 Abnormal weight loss: Secondary | ICD-10-CM | POA: Insufficient documentation

## 2014-12-28 DIAGNOSIS — R1011 Right upper quadrant pain: Secondary | ICD-10-CM | POA: Insufficient documentation

## 2014-12-28 DIAGNOSIS — R1033 Periumbilical pain: Secondary | ICD-10-CM | POA: Diagnosis not present

## 2014-12-28 DIAGNOSIS — Z720 Tobacco use: Secondary | ICD-10-CM | POA: Insufficient documentation

## 2014-12-28 DIAGNOSIS — R112 Nausea with vomiting, unspecified: Secondary | ICD-10-CM | POA: Diagnosis present

## 2014-12-28 LAB — URINALYSIS, ROUTINE W REFLEX MICROSCOPIC
Glucose, UA: NEGATIVE mg/dL
HGB URINE DIPSTICK: NEGATIVE
Ketones, ur: 40 mg/dL — AB
Nitrite: POSITIVE — AB
PH: 5 (ref 5.0–8.0)
PROTEIN: NEGATIVE mg/dL
SPECIFIC GRAVITY, URINE: 1.025 (ref 1.005–1.030)
Urobilinogen, UA: 4 mg/dL — ABNORMAL HIGH (ref 0.0–1.0)

## 2014-12-28 LAB — CBC
HCT: 44.6 % (ref 39.0–52.0)
HEMOGLOBIN: 15.9 g/dL (ref 13.0–17.0)
MCH: 29.7 pg (ref 26.0–34.0)
MCHC: 35.7 g/dL (ref 30.0–36.0)
MCV: 83.2 fL (ref 78.0–100.0)
Platelets: 292 10*3/uL (ref 150–400)
RBC: 5.36 MIL/uL (ref 4.22–5.81)
RDW: 14.9 % (ref 11.5–15.5)
WBC: 6 10*3/uL (ref 4.0–10.5)

## 2014-12-28 LAB — URINE MICROSCOPIC-ADD ON

## 2014-12-28 LAB — COMPREHENSIVE METABOLIC PANEL
ALT: 30 U/L (ref 17–63)
AST: 31 U/L (ref 15–41)
Albumin: 3.5 g/dL (ref 3.5–5.0)
Alkaline Phosphatase: 95 U/L (ref 38–126)
Anion gap: 14 (ref 5–15)
BUN: 18 mg/dL (ref 6–20)
CO2: 24 mmol/L (ref 22–32)
Calcium: 9.7 mg/dL (ref 8.9–10.3)
Chloride: 95 mmol/L — ABNORMAL LOW (ref 101–111)
Creatinine, Ser: 1.02 mg/dL (ref 0.61–1.24)
GFR calc Af Amer: >60 mL/min (ref >60)
GFR calc non Af Amer: >60 mL/min (ref >60)
Glucose, Bld: 94 mg/dL (ref 65–99)
Potassium: 3.6 mmol/L (ref 3.5–5.1)
SODIUM: 133 mmol/L — AB (ref 135–145)
Total Bilirubin: 2.4 mg/dL — ABNORMAL HIGH (ref 0.3–1.2)
Total Protein: 7.5 g/dL (ref 6.5–8.1)

## 2014-12-28 LAB — LIPASE, BLOOD: Lipase: 23 U/L (ref 22–51)

## 2014-12-28 MED ORDER — IOHEXOL 300 MG/ML  SOLN
80.0000 mL | Freq: Once | INTRAMUSCULAR | Status: AC | PRN
  Administered 2014-12-28: 80 mL via INTRAVENOUS

## 2014-12-28 MED ORDER — MORPHINE SULFATE 4 MG/ML IJ SOLN
4.0000 mg | Freq: Once | INTRAMUSCULAR | Status: AC
  Administered 2014-12-28: 4 mg via INTRAVENOUS
  Filled 2014-12-28: qty 1

## 2014-12-28 MED ORDER — DEXTROSE 5 % IV SOLN
1.0000 g | Freq: Once | INTRAVENOUS | Status: AC
  Administered 2014-12-28: 1 g via INTRAVENOUS
  Filled 2014-12-28: qty 10

## 2014-12-28 MED ORDER — ONDANSETRON 4 MG PO TBDP: 4.0000 mg | ORAL_TABLET | Freq: Three times a day (TID) | ORAL | Status: AC | PRN

## 2014-12-28 MED ORDER — SODIUM CHLORIDE 0.9 % IV BOLUS (SEPSIS)
1000.0000 mL | Freq: Once | INTRAVENOUS | Status: AC
  Administered 2014-12-28: 1000 mL via INTRAVENOUS

## 2014-12-28 MED ORDER — ONDANSETRON HCL 4 MG/2ML IJ SOLN
4.0000 mg | Freq: Once | INTRAMUSCULAR | Status: AC
  Administered 2014-12-28: 4 mg via INTRAVENOUS
  Filled 2014-12-28: qty 2

## 2014-12-28 MED ORDER — TAMSULOSIN HCL 0.4 MG PO CAPS: 0.4000 mg | ORAL_CAPSULE | Freq: Every day | ORAL | Status: AC

## 2014-12-28 NOTE — ED Notes (Signed)
Pt. Stated, I've been not able to eat for a week.  Everything I eat I throw it back up, especially meats.

## 2014-12-28 NOTE — ED Notes (Signed)
Pt drinking water 

## 2014-12-28 NOTE — ED Provider Notes (Signed)
CSN: [id number]     Arrival date & time 12/28/14  1407 History   First MD Initiated Contact with Patient 12/28/14 1613     Chief Complaint  Patient presents with  . Nausea  . Emesis     (Consider location/radiation/quality/duration/timing/severity/associated sxs/prior Treatment) HPI Comments: Patient is a 59 yo M PMHx significant for HTN, CVA presenting to the ED for evaluation of decreased PO appetite with nausea and vomiting. Patient states he he feels as though he has lost weight due to this. He states every time he tries to eat something he throws up especially meats. He states it is worse in the evening. He states he throws up 2-4 times a day it is nonbloody nonbilious. No history of endoscopy or colonoscopy. No abdominal surgical history. No modifying factors identified.   Patient is a 59 y.o. male presenting with vomiting.  Emesis Associated symptoms: no abdominal pain and no diarrhea     Past Medical History  Diagnosis Date  . Hypertension   . Stroke    History reviewed. No pertinent past surgical history. Family History  Problem Relation Age of Onset  . Diabetes Mother   . Heart disease Father   . Asthma Sister   . Asthma Brother   . Asthma Sister    History  Substance Use Topics  . Smoking status: Current Every Day Smoker -- 0.50 packs/day for 41 years    Types: Cigarettes  . Smokeless tobacco: Not on file     Comment: cutting back since hospitalization  . Alcohol Use: No    Review of Systems  Constitutional: Positive for unexpected weight change. Negative for fever.  Respiratory: Negative for shortness of breath.   Cardiovascular: Negative for chest pain.  Gastrointestinal: Positive for nausea and vomiting. Negative for abdominal pain, diarrhea, constipation, blood in stool and anal bleeding.  All other systems reviewed and are negative.     Allergies  Review of patient's allergies indicates no known allergies.  Home Medications   Prior to Admission  medications   Medication Sig Start Date End Date Taking? Authorizing Provider  aspirin EC 81 MG tablet Take 81 mg by mouth daily.   Yes Historical Provider, MD  levofloxacin (LEVAQUIN) 750 MG tablet Take 1 tablet (750 mg total) by mouth daily. Patient not taking: Reported on 12/28/2014 07/07/13   Esperanza Sheets, MD  ondansetron (ZOFRAN ODT) 4 MG disintegrating tablet Take 1 tablet (4 mg total) by mouth every 8 (eight) hours as needed for nausea or vomiting. 12/28/14   Francee Piccolo, PA-C  tamsulosin (FLOMAX) 0.4 MG CAPS capsule Take 1 capsule (0.4 mg total) by mouth daily. 12/28/14   Tilden Fossa, MD   BP 132/77 mmHg  Pulse 76  Temp(Src) 98.7 F (37.1 C) (Oral)  Resp 16  Ht  (1.803 m)  Wt 127 lb 8 oz (57.834 kg)  BMI 17.79 kg/m2  SpO2 99% Physical Exam  Constitutional: He is oriented to person, place, and time. He appears well-developed and well-nourished. No distress.  HENT:  Head: Normocephalic and atraumatic.  Right Ear: External ear normal.  Left Ear: External ear normal.  Nose: Nose normal.  Eyes: Conjunctivae are normal.  Neck: Neck supple.  Cardiovascular: Normal rate, regular rhythm and normal heart sounds.   Pulmonary/Chest: Effort normal and breath sounds normal. No respiratory distress.  Abdominal: Soft. Bowel sounds are normal. He exhibits no distension. There is tenderness in the right upper quadrant, epigastric area and periumbilical area. There is no rebound and  no guarding.  Musculoskeletal: Normal range of motion.  Neurological: He is alert and oriented to person, place, and time.  Skin: Skin is warm and dry. He is not diaphoretic.  Nursing note and vitals reviewed.   ED Course  Procedures (including critical care time) Medications  sodium chloride 0.9 % bolus 1,000 mL (0 mLs Intravenous Stopped 12/28/14 1816)  morphine 4 MG/ML injection 4 mg (4 mg Intravenous Given 12/28/14 1641)  ondansetron (ZOFRAN) injection 4 mg (4 mg Intravenous Given 12/28/14  1641)  iohexol (OMNIPAQUE) 300 MG/ML solution 80 mL (80 mLs Intravenous Contrast Given 12/28/14 1742)  cefTRIAXone (ROCEPHIN) 1 g in dextrose 5 % 50 mL IVPB (0 g Intravenous Stopped 12/28/14 2201)    Labs Review Labs Reviewed  COMPREHENSIVE METABOLIC PANEL - Abnormal; Notable for the following:    Sodium 133 (*)    Chloride 95 (*)    Total Bilirubin 2.4 (*)    All other components within normal limits  URINALYSIS, ROUTINE W REFLEX MICROSCOPIC (NOT AT [facility]) - Abnormal; Notable for the following:    Color, Urine AMBER (*)    APPearance HAZY (*)    Bilirubin Urine LARGE (*)    Ketones, ur 40 (*)    Urobilinogen, UA 4.0 (*)    Nitrite POSITIVE (*)    Leukocytes, UA SMALL (*)    All other components within normal limits  URINE MICROSCOPIC-ADD ON - Abnormal; Notable for the following:    Casts HYALINE CASTS (*)    All other components within normal limits  URINE CULTURE  LIPASE, BLOOD  CBC    Imaging Review US Abdomen Complete  12/28/2014   CLINICAL DATA:  Gallbladder not seen on CT. Nausea and vomiting 1 week.  EXAM: US ABDOMEN LIMITED - RIGHT UPPER QUADRANT  COMPARISON:  CT 12/28/2014  FINDINGS: Gallbladder:  Not visualized.  Common bile duct:  Diameter: 6.6 mm proximally and 7.9 mm distally. No definite ductal stones or distal obstructive process.  Liver:  No focal lesion identified. Within normal limits in parenchymal echogenicity.  IMPRESSION: Gallbladder not visualized. Mild prominence of the common bile duct which may be normal if gallbladder has been previously resected.   Electronically Signed   By: Elberta Fortis M.D.   On: 12/28/2014 19:47   Ct Abdomen Pelvis W Contrast  12/28/2014   CLINICAL DATA:  Pain for 1 week RIGHT-sided abdominal pain  EXAM: CT ABDOMEN AND PELVIS WITH CONTRAST  TECHNIQUE: Multidetector CT imaging of the abdomen and pelvis was performed using the standard protocol following bolus administration of intravenous contrast.  CONTRAST:  80mL OMNIPAQUE IOHEXOL 300  MG/ML  SOLN  COMPARISON:  None.  FINDINGS: Lower chest: Lung bases are clear.  Hepatobiliary: Liver has a lobular contour. No enhancing lesions present. No intrahepatic biliary duct dilatation. The gallbladder is not identified. Common bile duct is upper limits of normal at 5.7 mm.  Pancreas: Pancreas is normal. No ductal dilatation. No pancreatic inflammation.  Spleen: Normal spleen  Adrenals/urinary tract: Adrenal glands normal. Low-attenuation simple fluid attenuation lesions in the cortex of the RIGHT kidney. There is scarring the pole the RIGHT kidney.  Stomach/Bowel: Stomach, small bowel, duodenum, colon are unremarkable.  Vascular/Lymphatic: Intimal calcification aorta and soft plaque. Prominent periportal lymph node 10 mm.  Reproductive: Prostate gland is normal.  Musculoskeletal: No aggressive osseous lesion.  Other: No free fluid.  IMPRESSION: 1. Lobular contour of the liver suggests early cirrhosis. 2. Mild periportal adenopathy may relate to cirrhosis. 3. No biliary dilatation 4.  Atherosclerotic calcification of the abdominal aorta.   Electronically Signed   By: Genevive Bi M.D.   On: 12/28/2014 18:09     EKG Interpretation None      MDM   Final diagnoses:  Non-intractable vomiting with nausea, vomiting of unspecified type    Filed Vitals:   12/28/14 2247  BP: 132/77  Pulse: 76  Temp:   Resp: 16   Afebrile, NAD, non-toxic appearing, AAOx4.   I have reviewed nursing notes, vital signs, and all lab and all imaging results as noted above.  Patient presenting to the ED for evaluation of nausea, vomiting, and weight loss. On examination patient with RUQ, epigastric, and periumbilical abdominal tenderness without peritoneal signs. Labs reviewed, elevation of bilirubin. CT scan obtained, cirrhotic changes to liver noted, absent gallbladder no hx of cholecystectomy.  Abdominal ultrasound obtained, again no evidence of gallbladder. Patient able to tolerate PO intake in the ED. Will  discharge home with GI follow up for further evaluation of hepatic changes. Return precautions discussed. Patient is agreeable to plan. Patient is stable at time of discharge. Patient d/w with Dr. Madilyn Hook, agrees with plan.      Francee Piccolo, PA-C 12/29/14 2322  Tilden Fossa, MD 12/31/14 [phone]

## 2014-12-28 NOTE — Discharge Instructions (Signed)
Please follow up with your primary care physician in 1-2 days. If you do not have one please call the [facility] and wellness Center number listed above. Please follow up with [facility] to schedule a follow up appointment.  Please read all discharge instructions and return precautions.    Nausea and Vomiting Nausea is a sick feeling that often comes before throwing up (vomiting). Vomiting is a reflex where stomach contents come out of your mouth. Vomiting can cause severe loss of body fluids (dehydration). Children and elderly adults can become dehydrated quickly, especially if they also have diarrhea. Nausea and vomiting are symptoms of a condition or disease. It is important to find the cause of your symptoms. CAUSES   Direct irritation of the stomach lining. This irritation can result from increased acid production (gastroesophageal reflux disease), infection, food poisoning, taking certain medicines (such as nonsteroidal anti-inflammatory drugs), alcohol use, or tobacco use.  Signals from the brain.These signals could be caused by a headache, heat exposure, an inner ear disturbance, increased pressure in the brain from injury, infection, a tumor, or a concussion, pain, emotional stimulus, or metabolic problems.  An obstruction in the gastrointestinal tract (bowel obstruction).  Illnesses such as diabetes, hepatitis, gallbladder problems, appendicitis, kidney problems, cancer, sepsis, atypical symptoms of a heart attack, or eating disorders.  Medical treatments such as chemotherapy and radiation.  Receiving medicine that makes you sleep (general anesthetic) during surgery. DIAGNOSIS Your caregiver may ask for tests to be done if the problems do not improve after a few days. Tests may also be done if symptoms are severe or if the reason for the nausea and vomiting is not clear. Tests may include:  Urine tests.  Blood tests.  Stool tests.  Cultures (to look for evidence  of infection).  X-rays or other imaging studies. Test results can help your caregiver make decisions about treatment or the need for additional tests. TREATMENT You need to stay well hydrated. Drink frequently but in small amounts.You may wish to drink water, sports drinks, clear broth, or eat frozen ice pops or gelatin dessert to help stay hydrated.When you eat, eating slowly may help prevent nausea.There are also some antinausea medicines that may help prevent nausea. HOME CARE INSTRUCTIONS   Take all medicine as directed by your caregiver.  If you do not have an appetite, do not force yourself to eat. However, you must continue to drink fluids.  If you have an appetite, eat a normal diet unless your caregiver tells you differently.  Eat a variety of complex carbohydrates (rice, wheat, potatoes, bread), lean meats, yogurt, fruits, and vegetables.  Avoid high-fat foods because they are more difficult to digest.  Drink enough water and fluids to keep your urine clear or pale yellow.  If you are dehydrated, ask your caregiver for specific rehydration instructions. Signs of dehydration may include:  Severe thirst.  Dry lips and mouth.  Dizziness.  Dark urine.  Decreasing urine frequency and amount.  Confusion.  Rapid breathing or pulse. SEEK IMMEDIATE MEDICAL CARE IF:   You have blood or brown flecks (like coffee grounds) in your vomit.  You have black or bloody stools.  You have a severe headache or stiff neck.  You are confused.  You have severe abdominal pain.  You have chest pain or trouble breathing.  You do not urinate at least once every 8 hours.  You develop cold or clammy skin.  You continue to vomit for longer than 24 to 48 hours.  You have a fever. MAKE SURE YOU:   Understand these instructions.  Will watch your condition.  Will get help right away if you are not doing well or get worse. Document Released: 05/18/2005 Document Revised:  08/10/2011 Document Reviewed: 10/15/2010 [facility] Patient Information 2015 [place], [place]. This information is not intended to replace advice given to you by your health care provider. Make sure you discuss any questions you have with your health care provider.

## 2014-12-30 LAB — URINE CULTURE

## 2014-12-31 ENCOUNTER — Encounter: Payer: Self-pay | Admitting: Gastroenterology

## 2015-03-04 ENCOUNTER — Ambulatory Visit: Payer: Medicare Other | Admitting: Gastroenterology

## 2015-04-24 ENCOUNTER — Ambulatory Visit: Payer: Medicare Other | Admitting: Gastroenterology

## 2015-10-19 IMAGING — US US ABDOMEN COMPLETE
1 series · 14 of 25 positions shown · non-contrast
Comparison: CT 12/28/2014

CLINICAL DATA: Gallbladder not seen on CT. Nausea and vomiting 1
week.

EXAM:
US ABDOMEN LIMITED - RIGHT UPPER QUADRANT

[Series 1: us abdomen complete · 0.14mm/px · 14 of 40 slices shown]
[im 1/40]
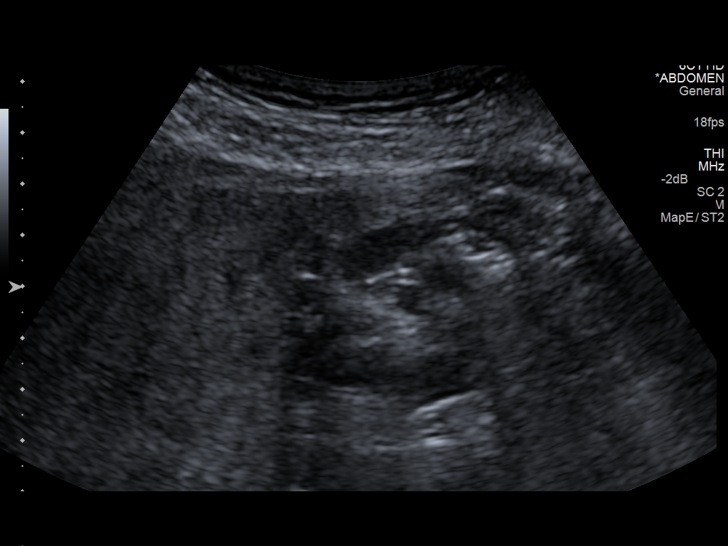
[im 4/40]
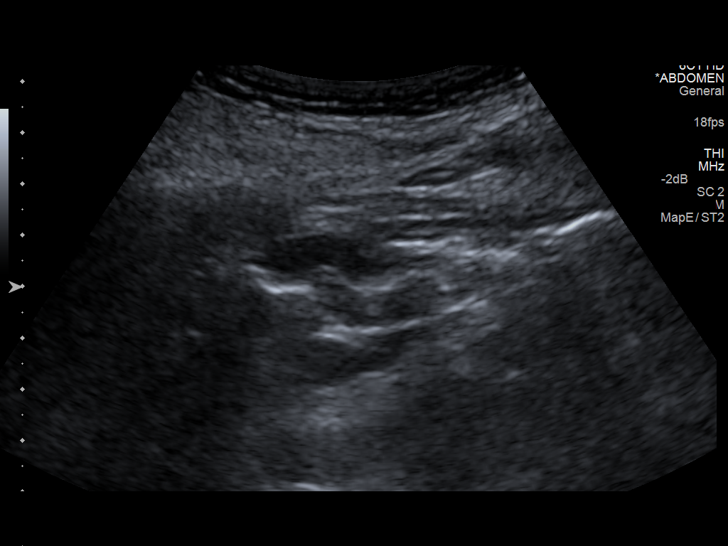
[im 7/40]
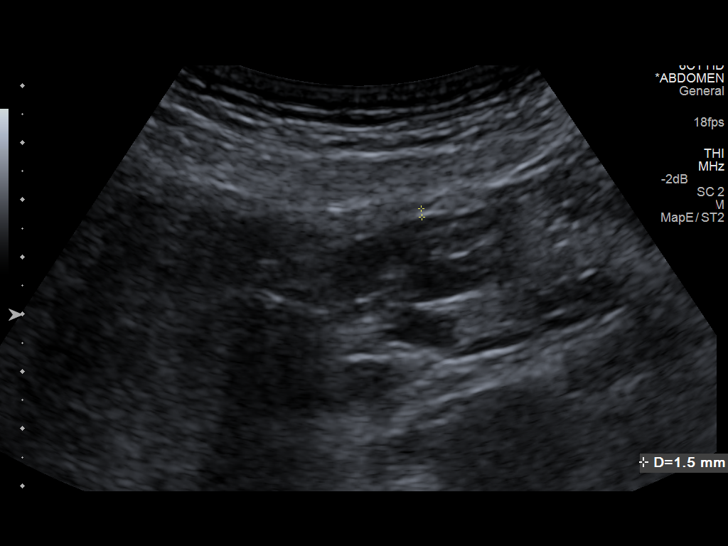
[im 10/40]
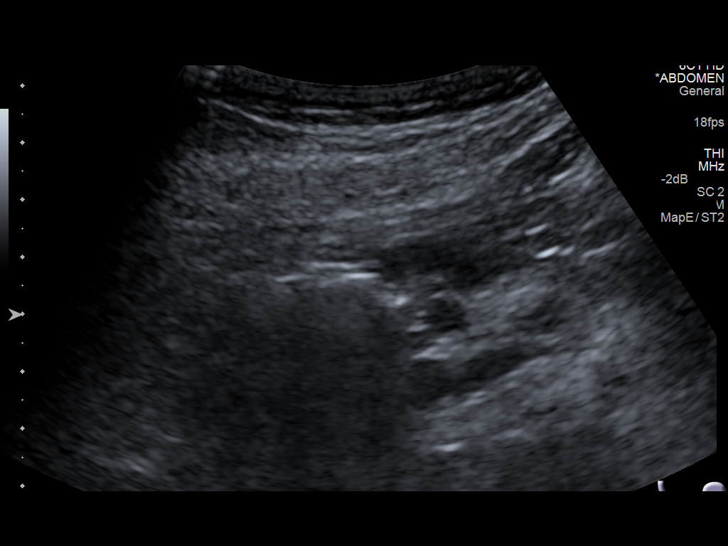
[im 14/40]
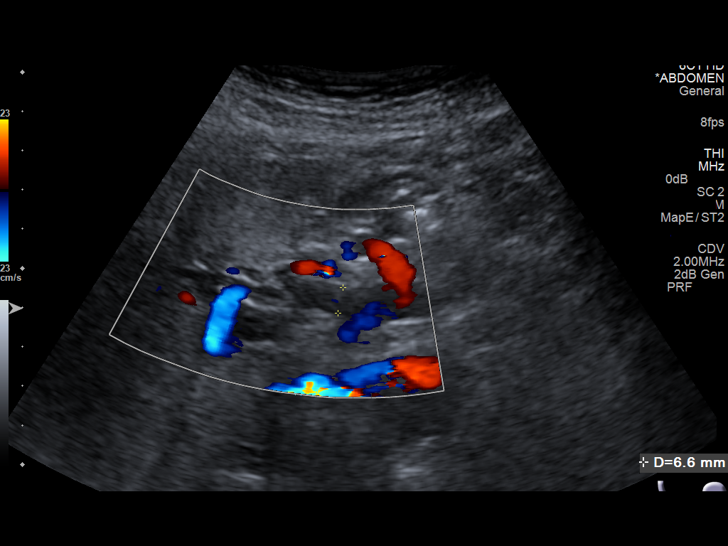
[im 15/40]
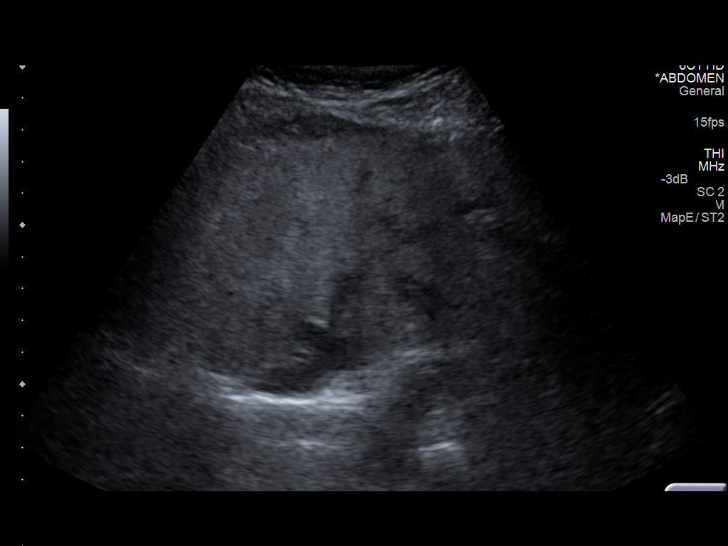
[im 18/40]
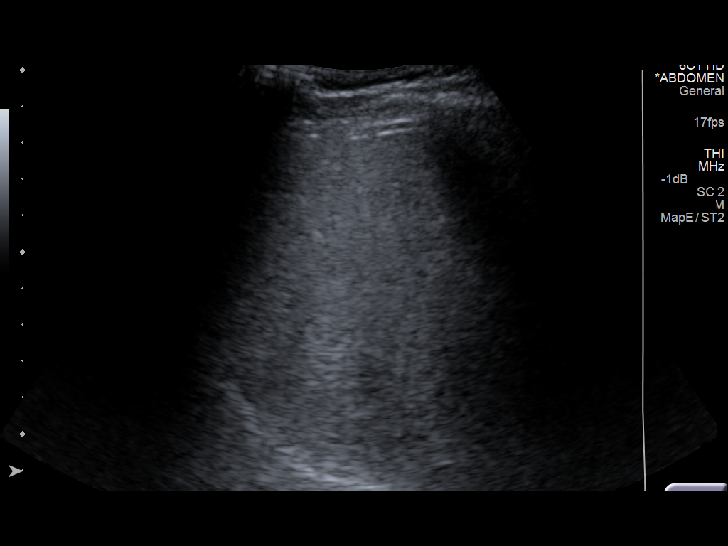
[im 22/40]
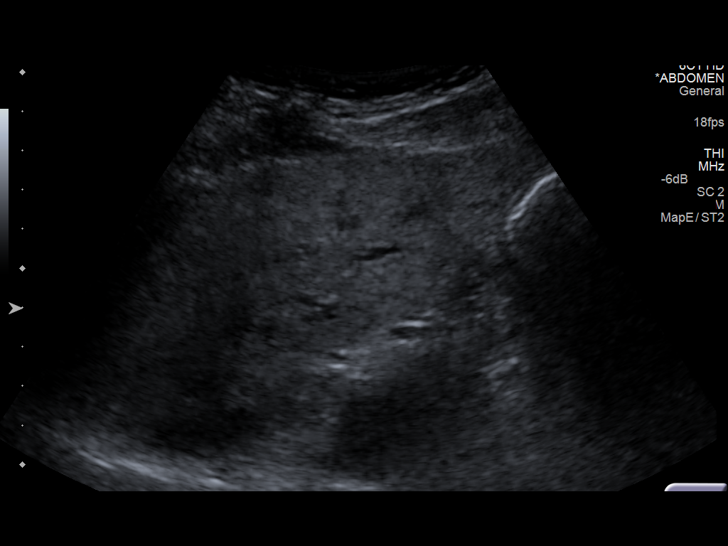
[im 25/40]
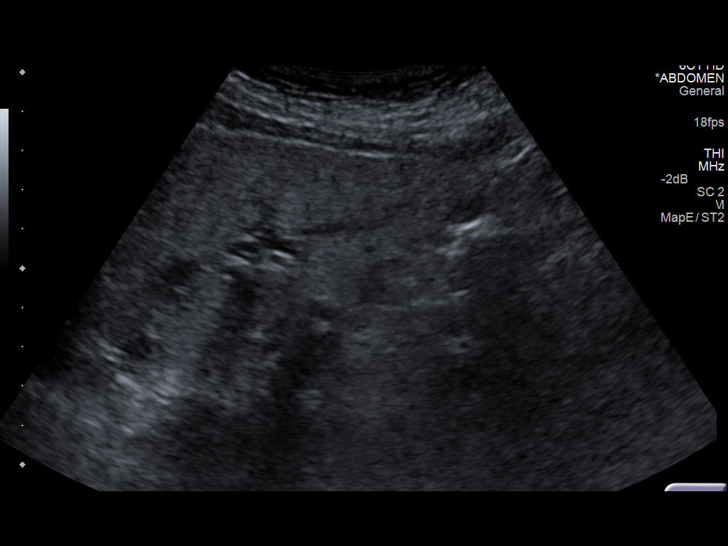
[im 27/40]
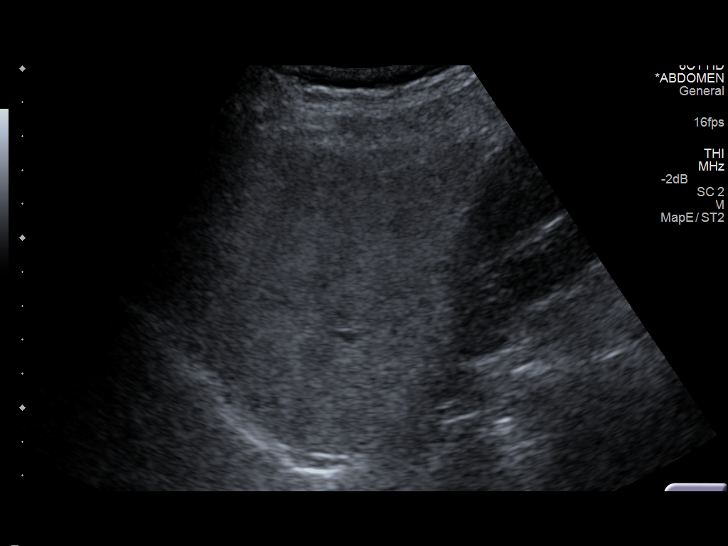
[im 30/40]
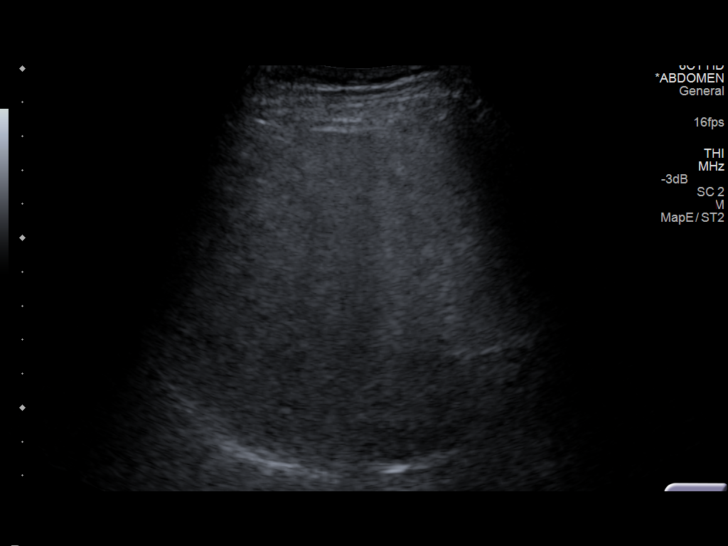
[im 33/40]
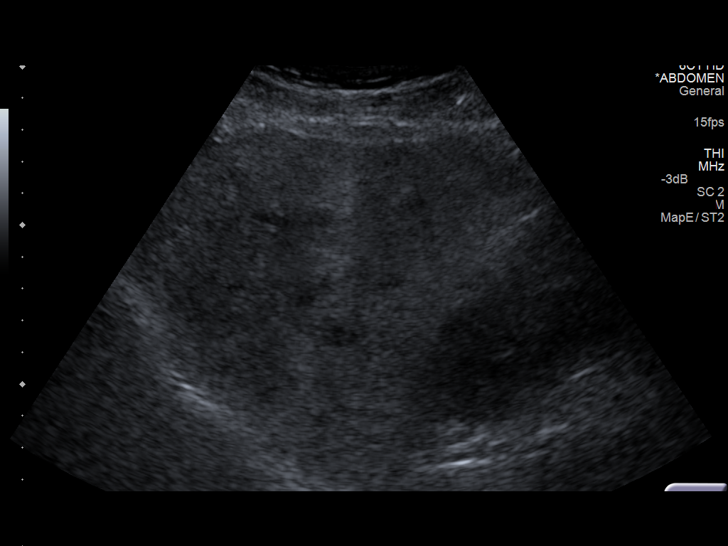
[im 36/40]
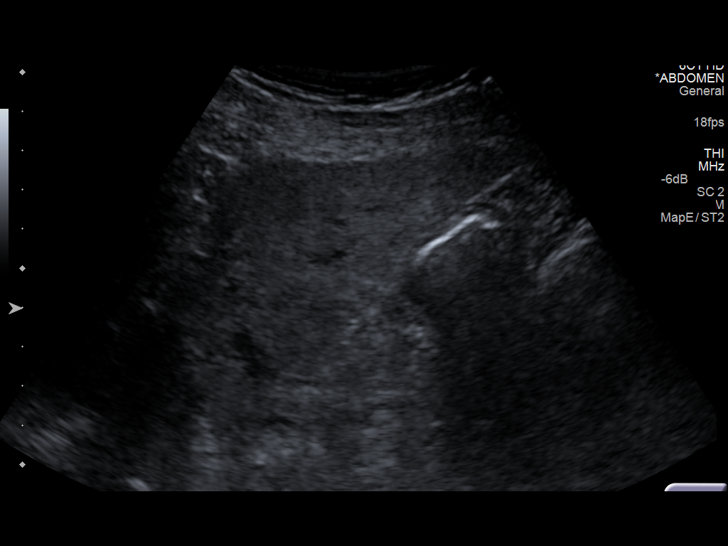
[im 40/40]
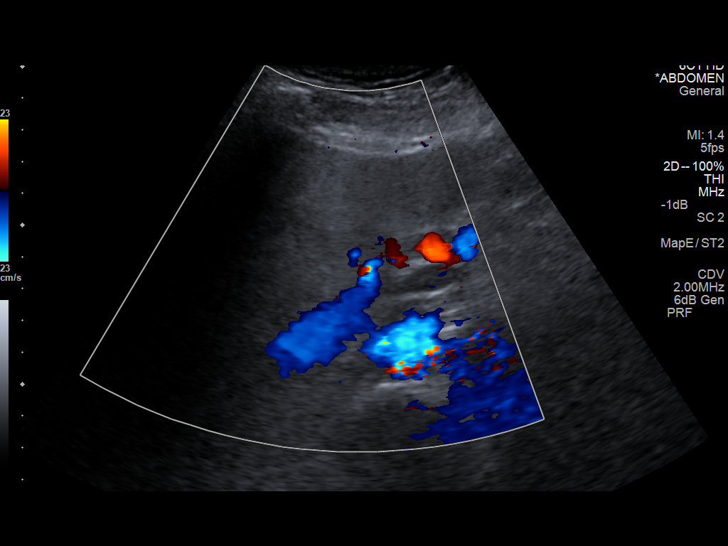

[14 of 25 positions shown; findings below may reference images not displayed]

FINDINGS: Gallbladder:

Not visualized.

Common bile duct:

Diameter: 6.6 mm proximally and 7.9 mm distally. No definite ductal
stones or distal obstructive process.

Liver:

No focal lesion identified. Within normal limits in parenchymal
echogenicity.
IMPRESSION: Gallbladder not visualized. Mild prominence of the common bile duct
which may be normal if gallbladder has been previously resected.

## 2015-10-31 DEATH — deceased

## 2017-01-30 IMAGING — CT CT ABD-PELV W/ CM
2 of 5 series · 16 of 46 positions shown, 18 images · IV contrast (APPLIED)
Comparison: None.

CLINICAL DATA: Pain for 1 week RIGHT-sided abdominal pain

EXAM:
CT ABDOMEN AND PELVIS WITH CONTRAST
TECHNIQUE: Multidetector CT imaging of the abdomen and pelvis was performed
using the standard protocol following bolus administration of
intravenous contrast.
CONTRAST:  80mL OMNIPAQUE IOHEXOL 300 MG/ML  SOLN

[Series 2: abd/ pelvis 5.0 i30f 1 · axial · 0.65mm/px · z∈[+819,+1249]mm · 13 of 98 slices shown, 15 images]
[im 6/98  soft-tissue]
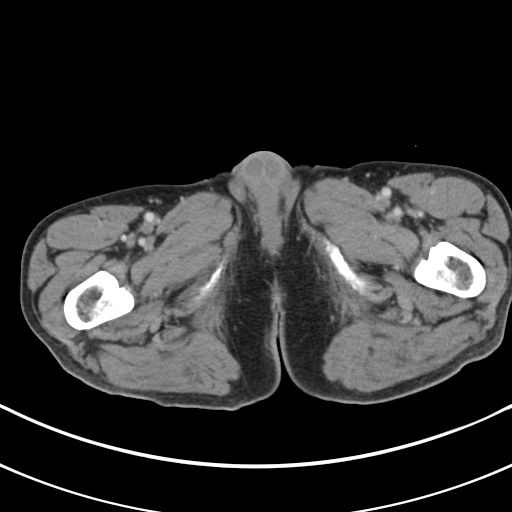
[im 6/98  bone]
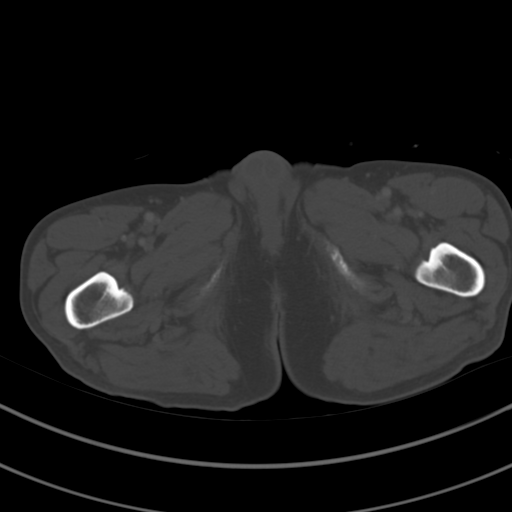
[im 11/98  soft-tissue]
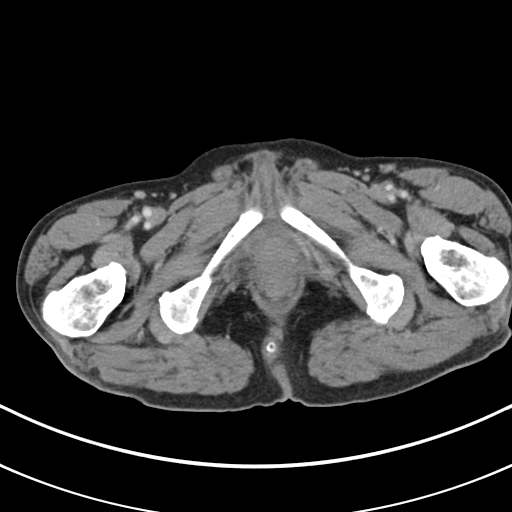
[im 22/98  soft-tissue]
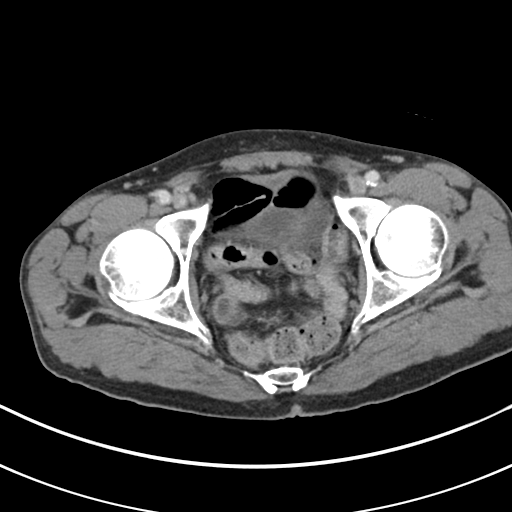
[im 27/98  soft-tissue]
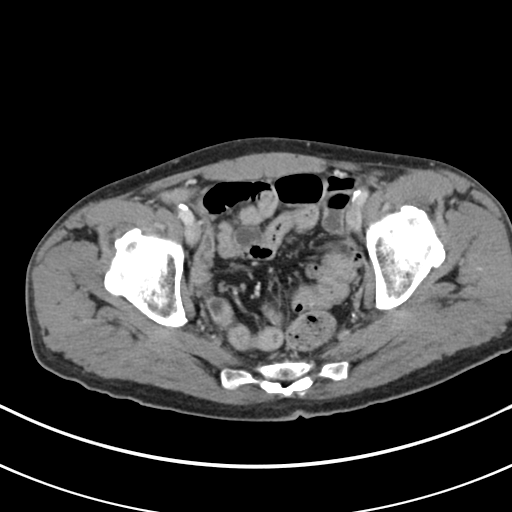
[im 33/98  soft-tissue]
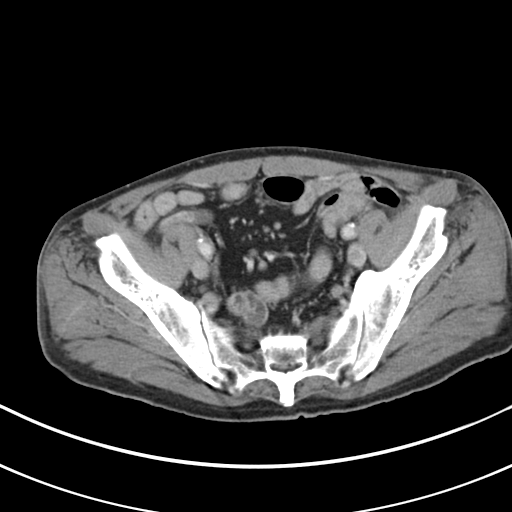
[im 44/98  soft-tissue]
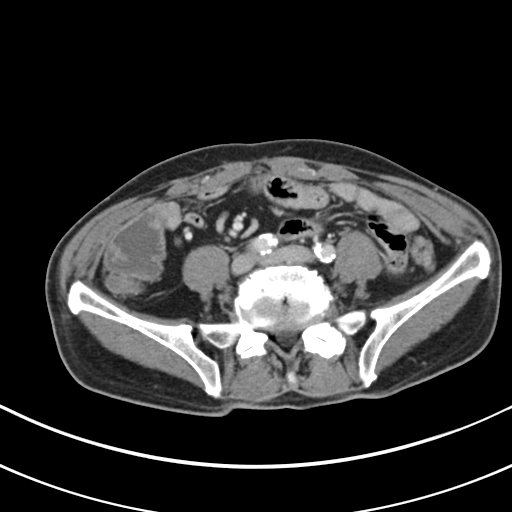
[im 49/98  soft-tissue]
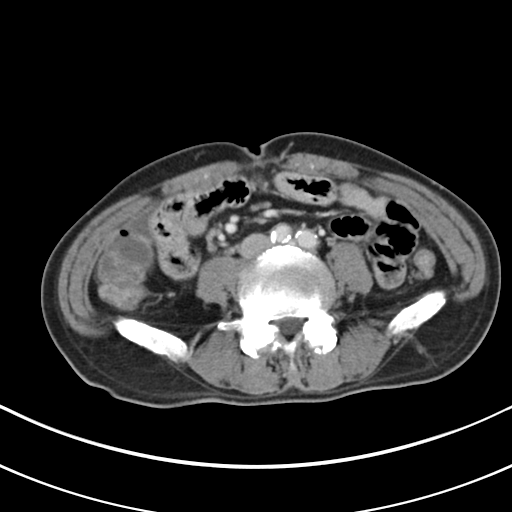
[im 54/98  soft-tissue]
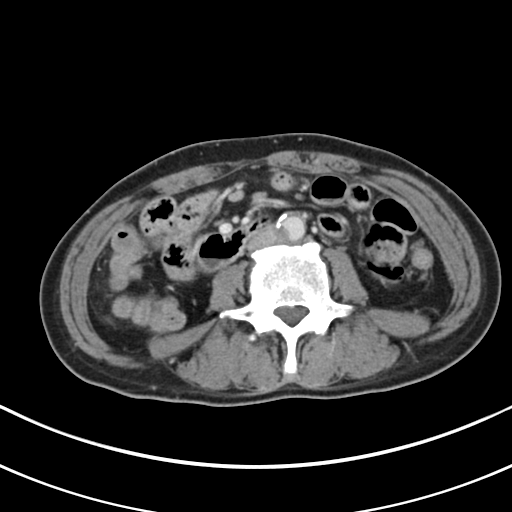
[im 65/98  soft-tissue]
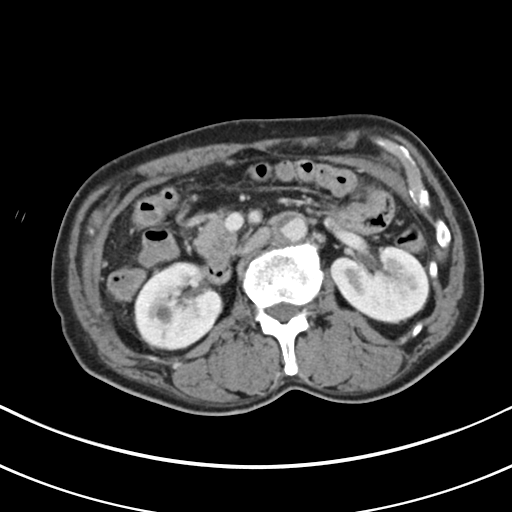
[im 65/98  bone]
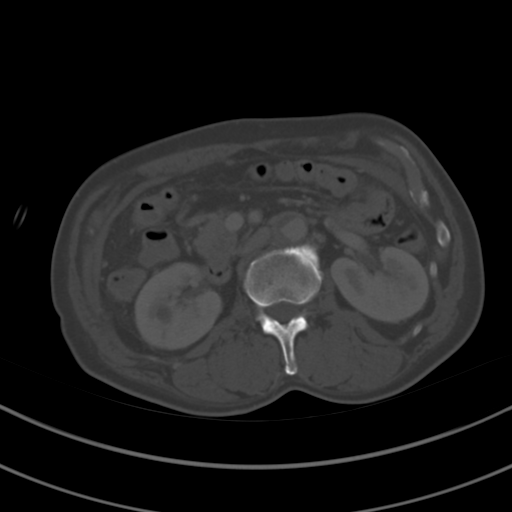
[im 71/98  soft-tissue]
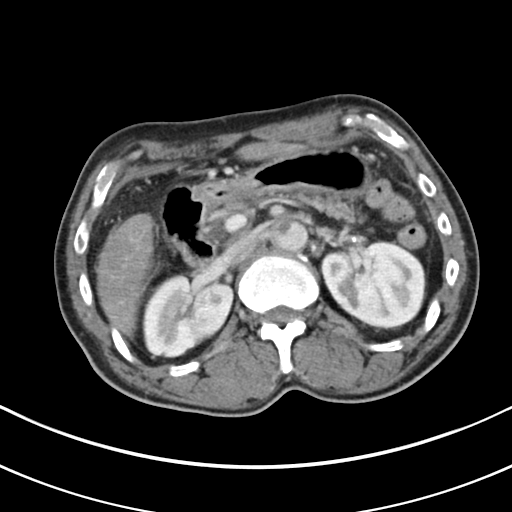
[im 76/98  soft-tissue]
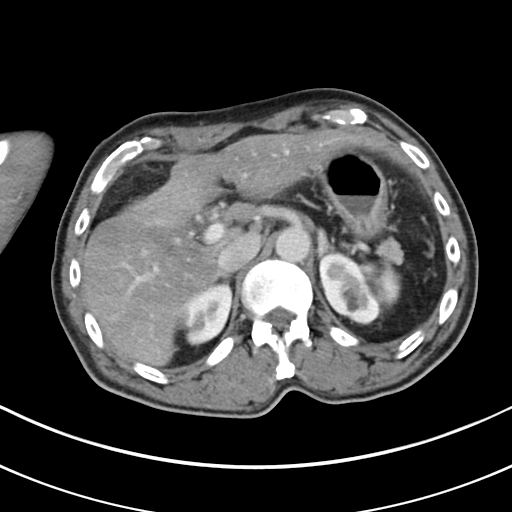
[im 87/98  soft-tissue]
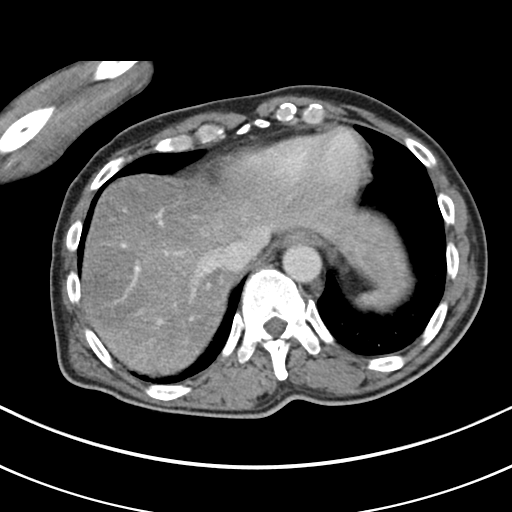
[im 92/98  soft-tissue]
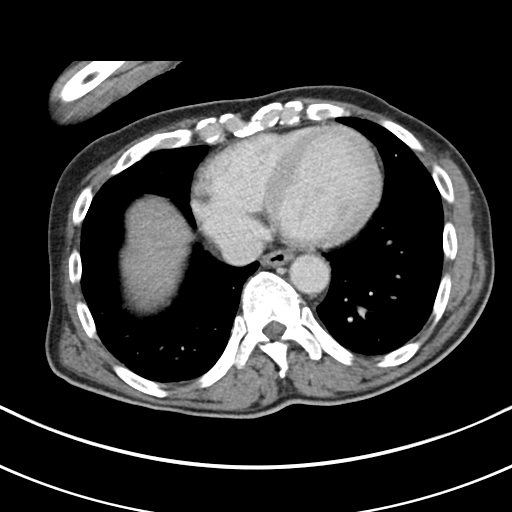

[Series 5: coronal soft tissue · coronal · 0.71mm/px · 3 of 65 slices shown]
[im 22/65  soft-tissue]
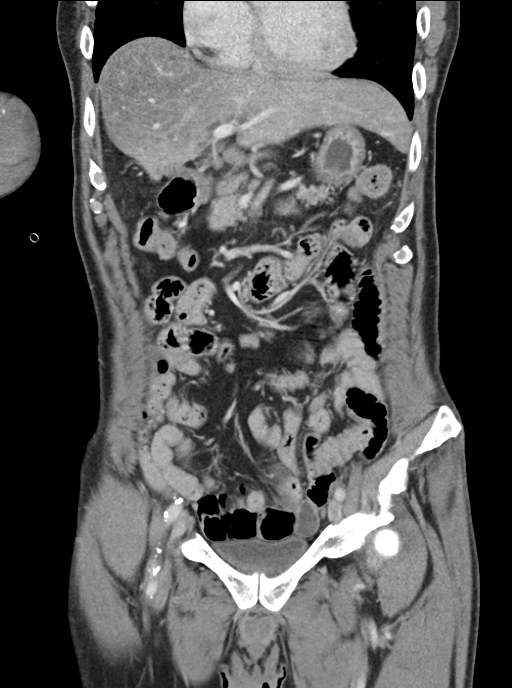
[im 29/65  soft-tissue]
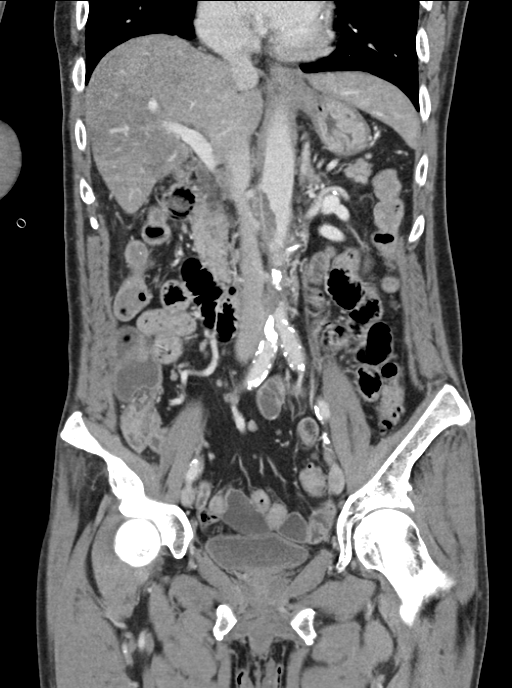
[im 36/65  soft-tissue]
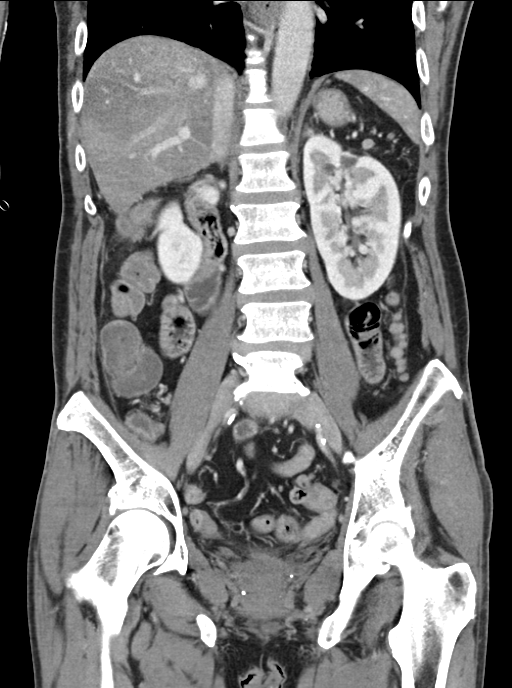

[16 of 46 positions shown; findings below may reference images not displayed]

FINDINGS: Lower chest: Lung bases are clear.

Hepatobiliary: Liver has a lobular contour. No enhancing lesions
present. No intrahepatic biliary duct dilatation. The gallbladder is
not identified. Common bile duct is upper limits of normal at
mm.

Pancreas: Pancreas is normal. No ductal dilatation. No pancreatic
inflammation.

Spleen: Normal spleen

Adrenals/urinary tract: Adrenal glands normal. Low-attenuation
simple fluid attenuation lesions in the cortex of the RIGHT kidney.
There is scarring the pole the RIGHT kidney.

Stomach/Bowel: Stomach, small bowel, duodenum, colon are
unremarkable.

Vascular/Lymphatic: Intimal calcification aorta and soft plaque.
Prominent periportal lymph node 10 mm.

Reproductive: Prostate gland is normal.

Musculoskeletal: No aggressive osseous lesion.

Other: No free fluid.
IMPRESSION: 1. Lobular contour of the liver suggests early cirrhosis.
2. Mild periportal adenopathy may relate to cirrhosis.
3. No biliary dilatation
4.  Atherosclerotic calcification of the abdominal aorta.
